# Patient Record
Sex: Female | Born: 1960 | Race: Black or African American | Hispanic: No | Smoking: Current every day smoker
Health system: Southern US, Community
[De-identification: ages and names within clinical notes are randomized; demographics above are authoritative.]

## PROBLEM LIST (undated history)

## (undated) DIAGNOSIS — G8929 Other chronic pain: Secondary | ICD-10-CM

## (undated) DIAGNOSIS — D4959 Neoplasm of unspecified behavior of other genitourinary organ: Secondary | ICD-10-CM

## (undated) DIAGNOSIS — M542 Cervicalgia: Secondary | ICD-10-CM

## (undated) DIAGNOSIS — K219 Gastro-esophageal reflux disease without esophagitis: Secondary | ICD-10-CM

## (undated) DIAGNOSIS — M5412 Radiculopathy, cervical region: Secondary | ICD-10-CM

## (undated) DIAGNOSIS — R519 Headache, unspecified: Secondary | ICD-10-CM

## (undated) DIAGNOSIS — J45909 Unspecified asthma, uncomplicated: Secondary | ICD-10-CM

## (undated) DIAGNOSIS — C50919 Malignant neoplasm of unspecified site of unspecified female breast: Secondary | ICD-10-CM

## (undated) DIAGNOSIS — R51 Headache: Secondary | ICD-10-CM

## (undated) HISTORY — PX: ABDOMINAL HYSTERECTOMY: SHX81

## (undated) HISTORY — PX: LEFT OOPHORECTOMY: SHX1961

---

## 2001-08-15 ENCOUNTER — Encounter: Payer: Self-pay | Admitting: Emergency Medicine

## 2001-08-15 ENCOUNTER — Emergency Department (HOSPITAL_COMMUNITY): Admission: EM | Admit: 2001-08-15 | Discharge: 2001-08-15 | Payer: Self-pay | Admitting: Emergency Medicine

## 2002-02-20 ENCOUNTER — Emergency Department (HOSPITAL_COMMUNITY): Admission: EM | Admit: 2002-02-20 | Discharge: 2002-02-20 | Payer: Self-pay | Admitting: Emergency Medicine

## 2002-02-20 ENCOUNTER — Encounter: Payer: Self-pay | Admitting: Emergency Medicine

## 2003-04-14 ENCOUNTER — Emergency Department (HOSPITAL_COMMUNITY): Admission: EM | Admit: 2003-04-14 | Discharge: 2003-04-14 | Payer: Self-pay | Admitting: Emergency Medicine

## 2004-05-11 ENCOUNTER — Emergency Department (HOSPITAL_COMMUNITY): Admission: EM | Admit: 2004-05-11 | Discharge: 2004-05-11 | Payer: Self-pay | Admitting: Emergency Medicine

## 2004-06-07 ENCOUNTER — Emergency Department (HOSPITAL_COMMUNITY): Admission: EM | Admit: 2004-06-07 | Discharge: 2004-06-08 | Payer: Self-pay | Admitting: Emergency Medicine

## 2004-06-22 ENCOUNTER — Emergency Department (HOSPITAL_COMMUNITY): Admission: EM | Admit: 2004-06-22 | Discharge: 2004-06-22 | Payer: Self-pay | Admitting: Emergency Medicine

## 2006-10-22 ENCOUNTER — Emergency Department (HOSPITAL_COMMUNITY): Admission: EM | Admit: 2006-10-22 | Discharge: 2006-10-22 | Payer: Self-pay | Admitting: Emergency Medicine

## 2008-03-21 ENCOUNTER — Emergency Department (HOSPITAL_COMMUNITY): Admission: EM | Admit: 2008-03-21 | Discharge: 2008-03-21 | Payer: Self-pay | Admitting: Emergency Medicine

## 2009-05-07 ENCOUNTER — Emergency Department (HOSPITAL_COMMUNITY): Admission: EM | Admit: 2009-05-07 | Discharge: 2009-05-07 | Payer: Self-pay | Admitting: Emergency Medicine

## 2009-08-08 ENCOUNTER — Emergency Department (HOSPITAL_COMMUNITY): Admission: EM | Admit: 2009-08-08 | Discharge: 2009-08-08 | Payer: Self-pay | Admitting: Emergency Medicine

## 2009-08-10 ENCOUNTER — Observation Stay (HOSPITAL_COMMUNITY): Admission: EM | Admit: 2009-08-10 | Discharge: 2009-08-10 | Payer: Self-pay | Admitting: Emergency Medicine

## 2009-09-20 ENCOUNTER — Emergency Department (HOSPITAL_COMMUNITY): Admission: EM | Admit: 2009-09-20 | Discharge: 2009-09-20 | Payer: Self-pay | Admitting: Emergency Medicine

## 2009-10-05 ENCOUNTER — Emergency Department (HOSPITAL_COMMUNITY): Admission: EM | Admit: 2009-10-05 | Discharge: 2009-10-05 | Payer: Self-pay | Admitting: Emergency Medicine

## 2010-07-01 LAB — URINALYSIS, ROUTINE W REFLEX MICROSCOPIC
Bilirubin Urine: NEGATIVE
Glucose, UA: NEGATIVE mg/dL
Hgb urine dipstick: NEGATIVE
Nitrite: NEGATIVE
Nitrite: NEGATIVE
Specific Gravity, Urine: 1.025 (ref 1.005–1.030)
Urobilinogen, UA: 0.2 mg/dL (ref 0.0–1.0)
Urobilinogen, UA: 1 mg/dL (ref 0.0–1.0)
pH: 6 (ref 5.0–8.0)

## 2010-07-01 LAB — DIFFERENTIAL
Basophils Relative: 0 % (ref 0–1)
Eosinophils Relative: 0 % (ref 0–5)
Lymphs Abs: 4.1 10*3/uL — ABNORMAL HIGH (ref 0.7–4.0)
Monocytes Absolute: 1.1 10*3/uL — ABNORMAL HIGH (ref 0.1–1.0)
Monocytes Relative: 15 % — ABNORMAL HIGH (ref 3–12)
Neutro Abs: 2.1 10*3/uL (ref 1.7–7.7)
Neutrophils Relative %: 29 % — ABNORMAL LOW (ref 43–77)

## 2010-07-01 LAB — PROTIME-INR
INR: 1.05 (ref 0.00–1.49)
Prothrombin Time: 13.6 seconds (ref 11.6–15.2)

## 2010-07-01 LAB — CK TOTAL AND CKMB (NOT AT ARMC)
CK, MB: 0.7 ng/mL (ref 0.3–4.0)
Relative Index: INVALID (ref 0.0–2.5)

## 2010-07-01 LAB — COMPREHENSIVE METABOLIC PANEL
AST: 30 U/L (ref 0–37)
GFR calc non Af Amer: >60 mL/min (ref >60)
Glucose, Bld: 93 mg/dL (ref 70–99)
Potassium: 3.8 mEq/L (ref 3.5–5.1)
Sodium: 137 mEq/L (ref 135–145)
Total Bilirubin: 0.6 mg/dL (ref 0.3–1.2)

## 2010-07-01 LAB — URINE CULTURE
Colony Count: NO GROWTH
Culture: NO GROWTH

## 2010-07-01 LAB — CBC
HCT: 36.8 % (ref 36.0–46.0)
MCHC: 35.2 g/dL (ref 30.0–36.0)
RDW: 14.6 % (ref 11.5–15.5)
WBC: 7.3 10*3/uL (ref 4.0–10.5)

## 2010-07-01 LAB — ROCKY MTN SPOTTED FVR AB, IGG-BLOOD: RMSF IgG: 0.33 IV

## 2010-07-02 LAB — URINALYSIS, ROUTINE W REFLEX MICROSCOPIC
Bilirubin Urine: NEGATIVE
Ketones, ur: NEGATIVE mg/dL
Urobilinogen, UA: 0.2 mg/dL (ref 0.0–1.0)
pH: 6.5 (ref 5.0–8.0)

## 2010-07-02 LAB — DIFFERENTIAL
Basophils Absolute: 0 10*3/uL (ref 0.0–0.1)
Basophils Relative: 1 % (ref 0–1)
Eosinophils Relative: 1 % (ref 0–5)

## 2010-07-02 LAB — COMPREHENSIVE METABOLIC PANEL
AST: 19 U/L (ref 0–37)
Alkaline Phosphatase: 69 U/L (ref 39–117)
BUN: 7 mg/dL (ref 6–23)
CO2: 26 mEq/L (ref 19–32)
Creatinine, Ser: 0.72 mg/dL (ref 0.4–1.2)
GFR calc Af Amer: >60 mL/min (ref >60)
Glucose, Bld: 95 mg/dL (ref 70–99)
Sodium: 135 mEq/L (ref 135–145)
Total Protein: 7.9 g/dL (ref 6.0–8.3)

## 2010-07-02 LAB — CBC
Hemoglobin: 14.1 g/dL (ref 12.0–15.0)
RBC: 4.57 MIL/uL (ref 3.87–5.11)
RDW: 14 % (ref 11.5–15.5)
WBC: 5.2 10*3/uL (ref 4.0–10.5)

## 2010-07-02 LAB — URINE CULTURE: Culture: NO GROWTH

## 2010-07-02 LAB — LIPASE, BLOOD: Lipase: 21 U/L (ref 11–59)

## 2010-07-03 LAB — CBC
MCHC: 35 g/dL (ref 30.0–36.0)
MCV: 92 fL (ref 78.0–100.0)
Platelets: 178 10*3/uL (ref 150–400)
RDW: 14.4 % (ref 11.5–15.5)

## 2010-07-03 LAB — DIFFERENTIAL
Basophils Absolute: 0.2 10*3/uL — ABNORMAL HIGH (ref 0.0–0.1)
Basophils Relative: 2 % — ABNORMAL HIGH (ref 0–1)
Eosinophils Absolute: 0.2 10*3/uL (ref 0.0–0.7)
Lymphocytes Relative: 34 % (ref 12–46)

## 2010-07-03 LAB — URINALYSIS, ROUTINE W REFLEX MICROSCOPIC
Bilirubin Urine: NEGATIVE
Bilirubin Urine: NEGATIVE
Glucose, UA: NEGATIVE mg/dL
Glucose, UA: NEGATIVE mg/dL
Hgb urine dipstick: NEGATIVE
Hgb urine dipstick: NEGATIVE
Ketones, ur: NEGATIVE mg/dL
Nitrite: NEGATIVE
Specific Gravity, Urine: >1.03 — ABNORMAL HIGH (ref 1.005–1.030)
Specific Gravity, Urine: 1.006 (ref 1.005–1.030)
Urobilinogen, UA: 0.2 mg/dL (ref 0.0–1.0)
pH: 6 (ref 5.0–8.0)
pH: 6.5 (ref 5.0–8.0)

## 2010-07-03 LAB — BASIC METABOLIC PANEL
BUN: 11 mg/dL (ref 6–23)
Calcium: 9.1 mg/dL (ref 8.4–10.5)
Chloride: 106 mEq/L (ref 96–112)
GFR calc Af Amer: >60 mL/min (ref >60)
GFR calc non Af Amer: >60 mL/min (ref >60)
Glucose, Bld: 97 mg/dL (ref 70–99)
Sodium: 136 mEq/L (ref 135–145)

## 2011-01-02 ENCOUNTER — Emergency Department (HOSPITAL_COMMUNITY): Payer: Self-pay

## 2011-01-02 ENCOUNTER — Emergency Department (HOSPITAL_COMMUNITY)
Admission: EM | Admit: 2011-01-02 | Discharge: 2011-01-02 | Disposition: A | Discharge status: Home / Self Care | Payer: Self-pay | Attending: Emergency Medicine | Admitting: Emergency Medicine

## 2011-01-02 DIAGNOSIS — M436 Torticollis: Secondary | ICD-10-CM | POA: Insufficient documentation

## 2011-01-02 DIAGNOSIS — F17200 Nicotine dependence, unspecified, uncomplicated: Secondary | ICD-10-CM | POA: Insufficient documentation

## 2011-01-02 MED ORDER — IBUPROFEN 800 MG PO TABS
800.0000 mg | ORAL_TABLET | Freq: Once | ORAL | Status: AC
  Administered 2011-01-02: 800 mg via ORAL
  Filled 2011-01-02: qty 1

## 2011-01-02 MED ORDER — CYCLOBENZAPRINE HCL 10 MG PO TABS: 10.0000 mg | ORAL_TABLET | Freq: Three times a day (TID) | ORAL | Status: AC | PRN

## 2011-01-02 MED ORDER — IBUPROFEN 800 MG PO TABS: 800.0000 mg | ORAL_TABLET | Freq: Three times a day (TID) | ORAL | Status: AC | PRN

## 2011-01-02 MED ORDER — CYCLOBENZAPRINE HCL 10 MG PO TABS
10.0000 mg | ORAL_TABLET | Freq: Once | ORAL | Status: AC
  Administered 2011-01-02: 10 mg via ORAL
  Filled 2011-01-02: qty 1

## 2011-01-02 NOTE — ED Notes (Signed)
Pt a/ox4. Resp even and unlabored. NAD at this time. D/C instructions reviewed with pt. Pt verbalized understanding. Pt ambulated to POV with steady gate. 

## 2011-01-02 NOTE — ED Notes (Signed)
Pt reports woke up Monday with pain in r arm  From shoulder down to hand.  Denies injury.  Says at times her fingers are numb.  Denies any injury.  Says thought maybe slept on her arm wrong.  Pt says pain has progressively gotten worse.  Denies any chest pain or jaw pain.

## 2011-01-02 NOTE — ED Provider Notes (Signed)
Medical screening examination/treatment/procedure(s) were performed by non-physician practitioner and as supervising physician I was immediately available for consultation/collaboration.  Geoffery Lyons, MD 01/02/11 [phone]

## 2011-01-02 NOTE — ED Provider Notes (Signed)
History     CSN: [id number] Arrival date & time: 01/02/2011  3:36 PM   Chief Complaint  Patient presents with  . Arm Pain     (Include location/radiation/quality/duration/timing/severity/associated sxs/prior treatment) Patient is a 50 y.o. female presenting with arm pain. The history is provided by the patient.  Arm Pain This is a new problem. Episode onset: 2 days ago. The problem occurs constantly. The problem has been gradually worsening. Associated symptoms include neck pain and numbness. Pertinent negatives include no abdominal pain, arthralgias, chest pain, chills, congestion, coughing, fatigue, fever, headaches, joint swelling, nausea, rash, sore throat or weakness.     History reviewed. No pertinent past medical history.   Past Surgical History  Procedure Date  . Abdominal hysterectomy     No family history on file.  History  Substance Use Topics  . Smoking status: Current Everyday Smoker  . Smokeless tobacco: Not on file  . Alcohol Use: No    OB History    Grav Para Term Preterm Abortions TAB SAB Ect Mult Living                  Review of Systems  Constitutional: Negative for fever, chills and fatigue.  HENT: Positive for neck pain and neck stiffness. Negative for congestion and sore throat.   Eyes: Negative.   Respiratory: Negative for cough, chest tightness and shortness of breath.   Cardiovascular: Negative for chest pain.  Gastrointestinal: Negative for nausea and abdominal pain.  Genitourinary: Negative.   Musculoskeletal: Negative for back pain, joint swelling and arthralgias.  Skin: Negative.  Negative for rash and wound.  Neurological: Positive for numbness. Negative for dizziness, weakness, light-headedness and headaches.  Hematological: Negative.   Psychiatric/Behavioral: Negative.     Allergies  Penicillins  Home Medications   Current Outpatient Rx  Name Route Sig Dispense Refill  . ACETAMINOPHEN 500 MG PO TABS Oral Take 1,000 mg by  mouth as needed. For pain     . ALBUTEROL SULFATE HFA 108 (90 BASE) MCG/ACT IN AERS Inhalation Inhale 2 puffs into the lungs every 6 (six) hours as needed. For shortness of breath     . CYCLOBENZAPRINE HCL 10 MG PO TABS Oral Take 1 tablet (10 mg total) by mouth 3 (three) times daily as needed for muscle spasms. 15 tablet 0  . IBUPROFEN 800 MG PO TABS Oral Take 1 tablet (800 mg total) by mouth every 8 (eight) hours as needed for pain. 15 tablet 0    Physical Exam    BP 124/89  Pulse 86  Temp(Src) 98.8 F (37.1 C) (Oral)  Resp 20  Ht 5\' 2"  (1.575 m)  Wt 147 lb (66.679 kg)  BMI 26.89 kg/m2  SpO2 99%  Physical Exam  Constitutional: She is oriented to person, place, and time. She appears well-developed and well-nourished.  HENT:  Head: Normocephalic.  Eyes: Conjunctivae are normal.  Neck: Normal range of motion. Neck supple.  Cardiovascular: Regular rhythm and intact distal pulses.        Pedal pulses normal.  Pulmonary/Chest: Effort normal. She has no wheezes.  Abdominal: Soft. Bowel sounds are normal. She exhibits no distension and no mass.  Musculoskeletal: She exhibits tenderness. She exhibits no edema.       Decreased ROM of neck with right head rotation.TTP right lateral neck musculature.  No midline ttp.  Neurological: She is alert and oriented to person, place, and time. She has normal strength. She displays no atrophy and no tremor. No cranial nerve  deficit or sensory deficit. Gait normal.  Reflex Scores:      Bicep reflexes are 2+ on the right side and 2+ on the left side.      No strength deficit noted.  Equal grip strength.  Decreased sensation to fine touch right dorsal hand.  Normal sensation in fingers and finger tips and forearm.  Skin: Skin is warm and dry. No rash noted.  Psychiatric: She has a normal mood and affect.    ED Course  Procedures   Dg Cervical Spine Complete  01/02/2011  *RADIOLOGY REPORT*  Clinical Data: Pain in right arm from shoulder by hand, no  known injury  CERVICAL SPINE - COMPLETE 4+ VIEW  Comparison: None  Findings: Vertebral body and disc space heights maintained. Prevertebral soft tissues upper normal thickness at C5. No fracture, subluxation or bone destruction. Mildly prominent adenoids. Bony foramina patent. C1 C2-11 normal.  IMPRESSION: No acute cervical spine abnormalities identified. If patient has persistent radicular symptoms, consider follow-up MRI imaging of the cervical spine.  Original Report Authenticated By: Lollie Marrow, M.D.     1. Torticollis, unspecified      MDM Torticollis.       Candis Musa, PA 01/02/11 1753

## 2011-01-02 NOTE — ED Notes (Signed)
Pt says pain in arm is much worse with movement.

## 2011-01-17 LAB — URINALYSIS, ROUTINE W REFLEX MICROSCOPIC
Nitrite: NEGATIVE
Protein, ur: NEGATIVE mg/dL
Specific Gravity, Urine: 1.015 (ref 1.005–1.030)
Urobilinogen, UA: 0.2 mg/dL (ref 0.0–1.0)

## 2011-01-17 LAB — DIFFERENTIAL
Basophils Relative: 0 % (ref 0–1)
Eosinophils Absolute: 0.2 10*3/uL (ref 0.0–0.7)
Lymphs Abs: 2.3 10*3/uL (ref 0.7–4.0)
Neutro Abs: 3.5 10*3/uL (ref 1.7–7.7)
Neutrophils Relative %: 55 % (ref 43–77)

## 2011-01-17 LAB — BASIC METABOLIC PANEL
BUN: 6 mg/dL (ref 6–23)
Calcium: 9 mg/dL (ref 8.4–10.5)
Chloride: 106 mEq/L (ref 96–112)
Creatinine, Ser: 0.68 mg/dL (ref 0.4–1.2)
GFR calc Af Amer: >60 mL/min (ref >60)

## 2011-01-17 LAB — CBC
MCHC: 34.1 g/dL (ref 30.0–36.0)
MCV: 92.4 fL (ref 78.0–100.0)
Platelets: 178 10*3/uL (ref 150–400)
WBC: 6.3 10*3/uL (ref 4.0–10.5)

## 2011-01-17 LAB — STREP A DNA PROBE: Group A Strep Probe: NEGATIVE

## 2011-01-17 LAB — GLUCOSE, CAPILLARY: Glucose-Capillary: 83 mg/dL (ref 70–99)

## 2012-08-10 ENCOUNTER — Encounter (HOSPITAL_COMMUNITY): Payer: Self-pay | Admitting: Emergency Medicine

## 2012-08-10 ENCOUNTER — Emergency Department (HOSPITAL_COMMUNITY): Payer: Self-pay

## 2012-08-10 ENCOUNTER — Emergency Department (HOSPITAL_COMMUNITY)
Admission: EM | Admit: 2012-08-10 | Discharge: 2012-08-11 | Disposition: A | Discharge status: Home / Self Care | Payer: Self-pay | Attending: Emergency Medicine | Admitting: Emergency Medicine

## 2012-08-10 DIAGNOSIS — R5381 Other malaise: Secondary | ICD-10-CM | POA: Insufficient documentation

## 2012-08-10 DIAGNOSIS — A5901 Trichomonal vulvovaginitis: Secondary | ICD-10-CM | POA: Insufficient documentation

## 2012-08-10 DIAGNOSIS — M65341 Trigger finger, right ring finger: Secondary | ICD-10-CM

## 2012-08-10 DIAGNOSIS — J3489 Other specified disorders of nose and nasal sinuses: Secondary | ICD-10-CM | POA: Insufficient documentation

## 2012-08-10 DIAGNOSIS — Z90711 Acquired absence of uterus with remaining cervical stump: Secondary | ICD-10-CM | POA: Insufficient documentation

## 2012-08-10 DIAGNOSIS — J309 Allergic rhinitis, unspecified: Secondary | ICD-10-CM | POA: Insufficient documentation

## 2012-08-10 DIAGNOSIS — F17200 Nicotine dependence, unspecified, uncomplicated: Secondary | ICD-10-CM | POA: Insufficient documentation

## 2012-08-10 DIAGNOSIS — M6530 Trigger finger, unspecified finger: Secondary | ICD-10-CM | POA: Insufficient documentation

## 2012-08-10 DIAGNOSIS — R109 Unspecified abdominal pain: Secondary | ICD-10-CM | POA: Insufficient documentation

## 2012-08-10 DIAGNOSIS — Z79899 Other long term (current) drug therapy: Secondary | ICD-10-CM | POA: Insufficient documentation

## 2012-08-10 DIAGNOSIS — R209 Unspecified disturbances of skin sensation: Secondary | ICD-10-CM | POA: Insufficient documentation

## 2012-08-10 DIAGNOSIS — Z880 Allergy status to penicillin: Secondary | ICD-10-CM | POA: Insufficient documentation

## 2012-08-10 LAB — URINALYSIS, ROUTINE W REFLEX MICROSCOPIC
Leukocytes, UA: NEGATIVE
Nitrite: NEGATIVE
Protein, ur: NEGATIVE mg/dL
Urobilinogen, UA: 0.2 mg/dL (ref 0.0–1.0)

## 2012-08-10 MED ORDER — IBUPROFEN 800 MG PO TABS
800.0000 mg | ORAL_TABLET | Freq: Once | ORAL | Status: AC
  Administered 2012-08-10: 800 mg via ORAL
  Filled 2012-08-10: qty 1

## 2012-08-10 NOTE — ED Notes (Signed)
Patient c/o left flank pain x 2-3 weeks.  Patient denies N/V or any urinary symptoms.

## 2012-08-10 NOTE — ED Provider Notes (Addendum)
History    This chart was scribed for Jones Skene, MD by Gerlean Ren, ED Scribe. This patient was seen in room [facility] and the patient's care was started at 11:31 PM    CSN: [id number]  Arrival date & time 08/10/12  2319   First MD Initiated Contact with Patient 08/10/12 2329      Chief Complaint  Patient presents with  . Flank Pain     The history is provided by the patient. No language interpreter was used.  Alexis Lucas is a 52 y.o. female who presents to the Emergency Department complaining of dull, throbbing pain over the left flank, LLQ, and left lower back area with no areas having greater or lesser pain waxing-and-waning over the past 2-3 weeks.  Pain is moderate. Pt denies dysuria, frequency, nausea, emesis, diarrhea, vaginal discharge, chest pains, rash, myalgias, arthralgias, sore throat, changes in vision, double vision, blurred vision.  Pt reports some regular seasonal allergy congestion and watery eyes that has resolved on its own.   Pt also reports a yeast infection several weeks ago and some unusual odor from her vagina that she thinks may be an STD.   Pt also reports chronic tingling in right hand with associated weakness, pain, and trigger finger in right 4th finger. Pt has had partial hysterectomy and left oophorectomy. Pt reports extensive family h/o DM.   History reviewed. No pertinent past medical history.  Past Surgical History  Procedure Laterality Date  . Abdominal hysterectomy      No family history on file.  History  Substance Use Topics  . Smoking status: Current Every Day Smoker  . Smokeless tobacco: Not on file  . Alcohol Use: No    No OB history provided.   Review of Systems  Constitutional: Negative for fever.  HENT: Negative for congestion, sore throat and sinus pressure.   Eyes: Negative for visual disturbance.  Respiratory: Negative for cough.   Cardiovascular: Negative for chest pain.  Gastrointestinal: Positive for abdominal  pain. Negative for nausea, vomiting and diarrhea.  Genitourinary: Positive for flank pain. Negative for dysuria, frequency and vaginal discharge.  Musculoskeletal: Positive for back pain. Negative for myalgias and arthralgias.  Skin: Negative for rash.  All other systems reviewed and are negative.    Allergies  Penicillins  Home Medications   Current Outpatient Rx  Name  Route  Sig  Dispense  Refill  . acetaminophen (TYLENOL) 500 MG tablet   Oral   Take 1,000 mg by mouth as needed. For pain          . albuterol (PROVENTIL HFA;VENTOLIN HFA) 108 (90 BASE) MCG/ACT inhaler   Inhalation   Inhale 2 puffs into the lungs every 6 (six) hours as needed. For shortness of breath            There were no vitals taken for this visit.  Physical Exam  Nursing notes reviewed.  Electronic medical record reviewed. VITAL SIGNS:   Filed Vitals:   08/10/12 2330  BP: 163/62  Pulse: 90  Temp: 97.8 F (36.6 C)  TempSrc: Oral  Resp: 18  Height: 5\' 2"  (1.575 m)  Weight: 157 lb (71.215 kg)  SpO2: 100%   CONSTITUTIONAL: Awake, oriented, appears non-toxic HENT: Atraumatic, normocephalic, oral mucosa pink and moist, airway patent. Nares patent without drainage. External ears normal. EYES: Conjunctiva clear, EOMI, PERRLA NECK: Trachea midline, non-tender, supple CARDIOVASCULAR: Normal heart rate, Normal rhythm, No murmurs, rubs, gallops PULMONARY/CHEST: Clear to auscultation, no rhonchi, wheezes, or rales.  Symmetrical breath sounds. Non-tender. ABDOMINAL: Non-distended, soft, non-tender - no rebound or guarding.  BS normal. No CVA tenderness PELVIC EXAM: normal external genitalia, vulva, vaginal cuff intact, status post total hysterectomy. NEUROLOGIC: Non-focal, moving all four extremities, no gross sensory or motor deficits. EXTREMITIES: No clubbing, cyanosis, or edema. Right ring finger, trigger finger - palpable knot in the flexor tendon around the A1 pulley. Positive Tinel's, positive  Phalen's. Strength is 5 out of 5 grip, wrist extension, flexion, finger abduction and adduction SKIN: Warm, Dry, No erythema, No rash  ED Course  Procedures (including critical care time) DIAGNOSTIC STUDIES: No O2 taken.    COORDINATION OF CARE: 11:45 PM- Patient informed of clinical course including pelvic exam.  She understands medical decision-making process and agrees with plan.  Labs Reviewed  URINALYSIS, ROUTINE W REFLEX MICROSCOPIC - Abnormal; Notable for the following:    Ketones, ur TRACE (*)    All other components within normal limits  BASIC METABOLIC PANEL - Abnormal; Notable for the following:    Glucose, Bld 112 (*)    All other components within normal limits  WET PREP, GENITAL  GC/CHLAMYDIA PROBE AMP   Dg Abd 2 Views  08/11/2012  *RADIOLOGY REPORT*  Clinical Data: Left flank pain for 3 weeks.  ABDOMEN - 2 VIEW  Comparison: CT abdomen and pelvis 08/08/2009  Findings: Normal bowel gas pattern with scattered gas and stool in the colon.  No small or large bowel distension.  No free intra- abdominal air.  No abnormal air fluid levels.  Multiple calcifications in the pelvis consistent with phleboliths.  No radiopaque stones are demonstrated.  Vascular calcifications. Visualized bones appear intact.  IMPRESSION: Nonobstructive bowel gas pattern.   Original Report Authenticated By: Burman Nieves, M.D.      1. Flank pain   2. Trigger ring finger, right       MDM  Alexis Lucas is a 52 y.o. female presenting with left flank pain. Patient's presentation is not concerning at this time for perforation, kidney stone, diverticulitis. She likely has a musculoskeletal pain along the left flank. We'll screen with urine, x-ray, history of DM in family, we'll also screen for kidney function and glucose levels with BMP.  Patient is concerned about STI because of her husband's separation from her, denies any vaginal discharge complaints, would like to be tested for STI.  Pelvic exam  is unremarkable, sent for GC chlamydia -doubt infection without cervix. No trichomonas seen in urine  Patient does have injured her finger on the right hand as well as carpal tunnel syndrome with no weakness yet, she is having night time pain symptoms, at this point will refer to Dr. Janee Morn who is on-call for hand this evening. Patient may benefit from discussion of carpal tunnel release and trigger finger release. Patient put in a right-sided Velcro splint - told to use this as much is possible but not to use it at work if it is causing a dangerous situation where she works on an [employer] stage [employer].  Patient's wet prep comes back positive for trichomonas, will treat with metronidazole. Discussed not drinking alcohol with metronidazole. Again I do not think it is prudent to treat the patient with other antibiotics for [facility] committee as the patient has no cervix,  Patient to follow up in 2-3 days with her primary care physician for her left flank pain  I personally performed the services described in this documentation, which was scribed in my presence. The recorded information has been reviewed and  is accurate. Jones Skene, M.D.     Jones Skene, MD 08/11/12 0104  Jones Skene, MD 08/11/12 9147

## 2012-08-11 LAB — BASIC METABOLIC PANEL
CO2: 24 mEq/L (ref 19–32)
Chloride: 102 mEq/L (ref 96–112)
GFR calc non Af Amer: >90 mL/min (ref >90)
Glucose, Bld: 112 mg/dL — ABNORMAL HIGH (ref 70–99)
Potassium: 3.5 mEq/L (ref 3.5–5.1)
Sodium: 136 mEq/L (ref 135–145)

## 2012-08-11 LAB — WET PREP, GENITAL

## 2012-08-11 MED ORDER — METRONIDAZOLE 500 MG PO TABS: 500.0000 mg | ORAL_TABLET | Freq: Two times a day (BID) | ORAL | Status: DC

## 2012-08-11 MED ORDER — IBUPROFEN 600 MG PO TABS: 600.0000 mg | ORAL_TABLET | Freq: Four times a day (QID) | ORAL | Status: DC | PRN

## 2012-11-01 ENCOUNTER — Encounter (HOSPITAL_COMMUNITY): Payer: Self-pay | Admitting: Emergency Medicine

## 2012-11-01 ENCOUNTER — Emergency Department (HOSPITAL_COMMUNITY)
Admission: EM | Admit: 2012-11-01 | Discharge: 2012-11-01 | Disposition: A | Discharge status: Home / Self Care | Payer: Self-pay | Attending: Emergency Medicine | Admitting: Emergency Medicine

## 2012-11-01 DIAGNOSIS — R3915 Urgency of urination: Secondary | ICD-10-CM | POA: Insufficient documentation

## 2012-11-01 DIAGNOSIS — F17200 Nicotine dependence, unspecified, uncomplicated: Secondary | ICD-10-CM | POA: Insufficient documentation

## 2012-11-01 DIAGNOSIS — Z90710 Acquired absence of both cervix and uterus: Secondary | ICD-10-CM | POA: Insufficient documentation

## 2012-11-01 DIAGNOSIS — M549 Dorsalgia, unspecified: Secondary | ICD-10-CM | POA: Insufficient documentation

## 2012-11-01 DIAGNOSIS — Z79899 Other long term (current) drug therapy: Secondary | ICD-10-CM | POA: Insufficient documentation

## 2012-11-01 DIAGNOSIS — Z880 Allergy status to penicillin: Secondary | ICD-10-CM | POA: Insufficient documentation

## 2012-11-01 DIAGNOSIS — R350 Frequency of micturition: Secondary | ICD-10-CM | POA: Insufficient documentation

## 2012-11-01 DIAGNOSIS — N390 Urinary tract infection, site not specified: Secondary | ICD-10-CM

## 2012-11-01 DIAGNOSIS — R630 Anorexia: Secondary | ICD-10-CM | POA: Insufficient documentation

## 2012-11-01 DIAGNOSIS — R319 Hematuria, unspecified: Secondary | ICD-10-CM | POA: Insufficient documentation

## 2012-11-01 DIAGNOSIS — R109 Unspecified abdominal pain: Secondary | ICD-10-CM | POA: Insufficient documentation

## 2012-11-01 LAB — URINALYSIS, ROUTINE W REFLEX MICROSCOPIC
Specific Gravity, Urine: 1.01 (ref 1.005–1.030)
Urobilinogen, UA: >8 mg/dL — ABNORMAL HIGH (ref 0.0–1.0)
pH: 5 (ref 5.0–8.0)

## 2012-11-01 LAB — URINE MICROSCOPIC-ADD ON

## 2012-11-01 MED ORDER — PHENAZOPYRIDINE HCL 200 MG PO TABS: 200.0000 mg | ORAL_TABLET | Freq: Three times a day (TID) | ORAL | Status: DC

## 2012-11-01 MED ORDER — SULFAMETHOXAZOLE-TRIMETHOPRIM 800-160 MG PO TABS: 1.0000 | ORAL_TABLET | Freq: Two times a day (BID) | ORAL | Status: DC

## 2012-11-01 NOTE — ED Notes (Signed)
Pt c/o painful urination and blood in urine since this am. Pain to suprapubic area. nad mm wet.

## 2012-11-01 NOTE — ED Provider Notes (Signed)
History    CSN: [id number] Arrival date & time 11/01/12  1547  First MD Initiated Contact with Patient 11/01/12 1603     Chief Complaint  Patient presents with  . Dysuria  . Hematuria   (Consider location/radiation/quality/duration/timing/severity/associated sxs/prior Treatment) Patient is a 52 y.o. female presenting with dysuria. The history is provided by the patient.  Dysuria Pain quality:  Burning Pain severity:  Moderate Onset quality:  Gradual Timing:  Intermittent Progression:  Worsening Chronicity:  New Recent urinary tract infections: no   Relieved by:  Nothing Worsened by:  Nothing tried Ineffective treatments:  Phenazopyridine Urinary symptoms: discolored urine   Associated symptoms: abdominal pain   Associated symptoms: no fever, no nausea, no vaginal discharge and no vomiting   Risk factors: hx of pyelonephritis (years ago) and sexually active (last intercourse yesterday)   Risk factors: no hx of urolithiasis, no kidney transplant, not pregnant, no renal disease, no sexually transmitted infections and no urinary catheter   Alexis Lucas is a 52 y.o. female who presents to the ED with UTI symptoms that started this morning. She took OTC pyridium without relief. She is a G5 P2 (SAB x3). Last pap smear one year ago and was normal, current sex partner x 32 years. Hysterectomy for birth control. No history of STI's.   History reviewed. No pertinent past medical history. Past Surgical History  Procedure Laterality Date  . Abdominal hysterectomy     History reviewed. No pertinent family history. History  Substance Use Topics  . Smoking status: Current Every Day Smoker  . Smokeless tobacco: Not on file  . Alcohol Use: No   OB History   Grav Para Term Preterm Abortions TAB SAB Ect Mult Living                 Review of Systems  Constitutional: Positive for appetite change. Negative for fever and chills.  HENT: Negative for ear pain, sore throat and neck pain.    Gastrointestinal: Positive for abdominal pain. Negative for nausea, vomiting and diarrhea.  Genitourinary: Positive for dysuria, urgency and frequency. Negative for vaginal bleeding, vaginal discharge, vaginal pain and dyspareunia.  Musculoskeletal: Positive for back pain.  Skin: Negative for rash.  Allergic/Immunologic: Negative for food allergies.  Neurological: Negative for light-headedness and headaches.  Psychiatric/Behavioral: The patient is not nervous/anxious.     Allergies  Penicillins  Home Medications   Current Outpatient Rx  Name  Route  Sig  Dispense  Refill  . acetaminophen (TYLENOL) 500 MG tablet   Oral   Take 1,000 mg by mouth as needed. For pain          . albuterol (PROVENTIL HFA;VENTOLIN HFA) 108 (90 BASE) MCG/ACT inhaler   Inhalation   Inhale 2 puffs into the lungs every 6 (six) hours as needed. For shortness of breath          . ibuprofen (ADVIL,MOTRIN) 600 MG tablet   Oral   Take 1 tablet (600 mg total) by mouth every 6 (six) hours as needed for pain.   30 tablet   0   . metroNIDAZOLE (FLAGYL) 500 MG tablet   Oral   Take 1 tablet (500 mg total) by mouth 2 (two) times daily.   14 tablet   0    BP 173/90  Pulse 72  Temp(Src) 98.4 F (36.9 C) (Oral)  Resp 16  SpO2 100% Physical Exam  Nursing note and vitals reviewed. Constitutional: She is oriented to person, place, and time. She  appears well-developed and well-nourished. No distress.  HENT:  Head: Normocephalic.  Eyes: EOM are normal.  Neck: Neck supple.  Cardiovascular: Normal rate.   Pulmonary/Chest: Effort normal.  Abdominal: Soft. Bowel sounds are normal. There is tenderness in the suprapubic area. There is no rigidity, no rebound, no guarding and no CVA tenderness.  Musculoskeletal: Normal range of motion.  Neurological: She is alert and oriented to person, place, and time. No cranial nerve deficit.  Skin: Skin is warm and dry.  Psychiatric: She has a normal mood and affect. Her  behavior is normal.    ED Course  Procedures  Results for orders placed during the hospital encounter of 11/01/12 (from the past 24 hour(s))  URINALYSIS, ROUTINE W REFLEX MICROSCOPIC     Status: Abnormal   Collection Time    11/01/12  4:05 PM      Result Value Range   Color, Urine ORANGE (*) YELLOW   APPearance CLOUDY (*) CLEAR   Specific Gravity, Urine 1.010  1.005 - 1.030   pH 5.0  5.0 - 8.0   Glucose, UA 250 (*) NEGATIVE mg/dL   Hgb urine dipstick LARGE (*) NEGATIVE   Bilirubin Urine SMALL (*) NEGATIVE   Ketones, ur TRACE (*) NEGATIVE mg/dL   Protein, ur 161 (*) NEGATIVE mg/dL   Urobilinogen, UA >0.9 (*) 0.0 - 1.0 mg/dL   Nitrite POSITIVE (*) NEGATIVE   Leukocytes, UA LARGE (*) NEGATIVE  URINE MICROSCOPIC-ADD ON     Status: Abnormal   Collection Time    11/01/12  4:05 PM      Result Value Range   Squamous Epithelial / LPF FEW (*) RARE   WBC, UA 11-20  <3 WBC/hpf   RBC / HPF 21-50  <3 RBC/hpf   Bacteria, UA FEW (*) RARE     MDM  52 y.o. female with UTI, will treat with oral antibiotics. No signs of pyelo at this time, no nausea or vomiting. Culture sent. Patient to follow up with PCP or return if symptoms worsen. Discussed with the patient clinical findings and plan of care and all questioned fully answered.   Medication List    TAKE these medications       phenazopyridine 200 MG tablet  Commonly known as:  PYRIDIUM  Take 1 tablet (200 mg total) by mouth 3 (three) times daily.     sulfamethoxazole-trimethoprim 800-160 MG per tablet  Commonly known as:  SEPTRA DS  Take 1 tablet by mouth 2 (two) times daily.      ASK your doctor about these medications       acetaminophen 500 MG tablet  Commonly known as:  TYLENOL  Take 1,000 mg by mouth as needed. For pain     albuterol 108 (90 BASE) MCG/ACT inhaler  Commonly known as:  PROVENTIL HFA;VENTOLIN HFA  Inhale 2 puffs into the lungs every 6 (six) hours as needed. For shortness of breath     ibuprofen 600 MG  tablet  Commonly known as:  ADVIL,MOTRIN  Take 1 tablet (600 mg total) by mouth every 6 (six) hours as needed for pain.     metroNIDAZOLE 500 MG tablet  Commonly known as:  FLAGYL  Take 1 tablet (500 mg total) by mouth 2 (two) times daily.         [place], [place] 11/01/12 [phone]

## 2012-11-01 NOTE — ED Notes (Signed)
Pt seen and evaluated by EDNP for initial assessment. 

## 2012-11-01 NOTE — ED Provider Notes (Signed)
Medical screening examination/treatment/procedure(s) were performed by non-physician practitioner and as supervising physician I was immediately available for consultation/collaboration.  Jaston Havens L Perris Tripathi, MD 11/01/12 1948 

## 2012-11-04 LAB — URINE CULTURE

## 2012-11-05 NOTE — ED Notes (Signed)
+   urine No treatment needed per Lisa Sanders 

## 2012-11-16 ENCOUNTER — Emergency Department (HOSPITAL_COMMUNITY)
Admission: EM | Admit: 2012-11-16 | Discharge: 2012-11-16 | Disposition: A | Discharge status: Home / Self Care | Payer: Self-pay | Attending: Emergency Medicine | Admitting: Emergency Medicine

## 2012-11-16 ENCOUNTER — Emergency Department (HOSPITAL_COMMUNITY): Payer: Self-pay

## 2012-11-16 ENCOUNTER — Encounter (HOSPITAL_COMMUNITY): Payer: Self-pay | Admitting: *Deleted

## 2012-11-16 DIAGNOSIS — Z79899 Other long term (current) drug therapy: Secondary | ICD-10-CM | POA: Insufficient documentation

## 2012-11-16 DIAGNOSIS — R0789 Other chest pain: Secondary | ICD-10-CM | POA: Insufficient documentation

## 2012-11-16 DIAGNOSIS — Z880 Allergy status to penicillin: Secondary | ICD-10-CM | POA: Insufficient documentation

## 2012-11-16 DIAGNOSIS — J45901 Unspecified asthma with (acute) exacerbation: Secondary | ICD-10-CM | POA: Insufficient documentation

## 2012-11-16 DIAGNOSIS — R110 Nausea: Secondary | ICD-10-CM | POA: Insufficient documentation

## 2012-11-16 DIAGNOSIS — R209 Unspecified disturbances of skin sensation: Secondary | ICD-10-CM | POA: Insufficient documentation

## 2012-11-16 DIAGNOSIS — F17200 Nicotine dependence, unspecified, uncomplicated: Secondary | ICD-10-CM | POA: Insufficient documentation

## 2012-11-16 HISTORY — DX: Unspecified asthma, uncomplicated: J45.909

## 2012-11-16 LAB — POCT I-STAT, CHEM 8
BUN: 8 mg/dL (ref 6–23)
Calcium, Ion: 1.16 mmol/L (ref 1.12–1.23)
Chloride: 109 meq/L (ref 96–112)
Creatinine, Ser: 0.7 mg/dL (ref 0.50–1.10)
Glucose, Bld: 102 mg/dL — ABNORMAL HIGH (ref 70–99)
HCT: 44 % (ref 36.0–46.0)
Hemoglobin: 15 g/dL (ref 12.0–15.0)
Potassium: 3.6 meq/L (ref 3.5–5.1)
Sodium: 142 mEq/L (ref 135–145)
TCO2: 21 mmol/L (ref 0–100)

## 2012-11-16 LAB — URINALYSIS, ROUTINE W REFLEX MICROSCOPIC
Glucose, UA: NEGATIVE mg/dL
Ketones, ur: NEGATIVE mg/dL
Leukocytes, UA: NEGATIVE
Protein, ur: NEGATIVE mg/dL
Urobilinogen, UA: 1 mg/dL (ref 0.0–1.0)

## 2012-11-16 LAB — POCT I-STAT TROPONIN I
Troponin i, poc: 0.01 ng/mL (ref 0.00–0.08)
Troponin i, poc: 0 ng/mL (ref 0.00–0.08)

## 2012-11-16 LAB — CBC
MCH: 30.3 pg (ref 26.0–34.0)
MCHC: 33.8 g/dL (ref 30.0–36.0)
RDW: 13.8 % (ref 11.5–15.5)

## 2012-11-16 MED ORDER — IPRATROPIUM BROMIDE 0.02 % IN SOLN
0.5000 mg | Freq: Once | RESPIRATORY_TRACT | Status: AC
  Administered 2012-11-16: 0.5 mg via RESPIRATORY_TRACT
  Filled 2012-11-16: qty 2.5

## 2012-11-16 MED ORDER — NITROGLYCERIN 0.4 MG SL SUBL: 0.4000 mg | SUBLINGUAL_TABLET | SUBLINGUAL | Status: DC | PRN

## 2012-11-16 MED ORDER — ALBUTEROL SULFATE (5 MG/ML) 0.5% IN NEBU
2.5000 mg | INHALATION_SOLUTION | Freq: Once | RESPIRATORY_TRACT | Status: AC
  Administered 2012-11-16: 2.5 mg via RESPIRATORY_TRACT
  Filled 2012-11-16: qty 0.5

## 2012-11-16 MED ORDER — PREDNISONE 20 MG PO TABS: ORAL_TABLET | ORAL | Status: DC

## 2012-11-16 NOTE — ED Provider Notes (Signed)
CSN: [id number]     Arrival date & time 11/16/12  0822 History     First MD Initiated Contact with Patient 11/16/12 [phone]     Chief Complaint  Patient presents with  . Chest Pain   (Consider location/radiation/quality/duration/timing/severity/associated sxs/prior Treatment) The history is provided by the patient. No language interpreter was used.  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 Patient reported that she that when she woke up this morning she did not feel like herself. Stated that when she woke up this morning at 5:00AM she was experiencing chest discomfort, localized to the right side and center of the chest described as a constant ache, pressure with pain radiating down the right arm described as a numbness and radiation towards the back. Patient reported that the pain worsens with motion, stated that the pain gets better when the patient lays down. Reported that when she was walking this morning she felt winded and out of breathe. Reported that she used her albuterol inhaler this morning, 2 puffs x 2 today. Reported mild nausea. Denied travel, fever, chills, dizziness, vomiting, diarrhea, abdominal pain, weakness, fever, chills, facial drooping, speech difficulty, blurred vision. Denied history of cardiac issues and HTN.  PCP Dr. Mirna Mires  Past Medical History  Diagnosis Date  . Asthma    Past Surgical History  Procedure Laterality Date  . Abdominal hysterectomy     No family history on file. History  Substance Use Topics  . Smoking status: Current Every Day Smoker  . Smokeless tobacco: Not on file  . Alcohol Use: No   OB History   Grav Para Term Preterm Abortions TAB SAB Ect Mult Living            Review of Systems  Constitutional: Negative for fever and chills.  HENT: Negative for sore throat, trouble swallowing, neck pain and neck stiffness.   Eyes: Negative for visual disturbance.  Respiratory: Positive for cough and shortness of breath.   Cardiovascular: Positive for chest pain.  Gastrointestinal: Positive for nausea. Negative for vomiting, abdominal pain and constipation.  Musculoskeletal: Positive for back pain.  Neurological: Positive for numbness (right arm). Negative for weakness and headaches.  All other systems reviewed and are negative.    Allergies  Penicillins  Home Medications   Current Outpatient Rx  Name  Route  Sig  Dispense  Refill  . albuterol (PROVENTIL HFA;VENTOLIN HFA) 108 (90 BASE) MCG/ACT inhaler   Inhalation   Inhale 2 puffs into the lungs every 6 (six) hours as needed. For shortness of breath          . predniSONE (DELTASONE) 20 MG tablet      Take 3 tablets PO x 3 days, take 2 tablets PO x 2 days, take 1 tablet PO x 1 day.   14 tablet   0   . sulfamethoxazole-trimethoprim (SEPTRA DS) 800-160 MG per tablet   Oral   Take 1 tablet by mouth 2 (two) times daily.   14 tablet   0    BP 160/77  Pulse 61  Temp(Src) 98.4 F (36.9 C) (Oral)  Resp 21  Ht 5\' 3"  (1.6 m)  Wt  146 lb (66.225 kg)  BMI 25.87 kg/m2  SpO2 100% Physical Exam  Nursing note and vitals reviewed. Constitutional: She is oriented to person, place, and time. She appears well-developed and well-nourished. No distress.  HENT:  Head: Normocephalic and atraumatic.  Eyes: Conjunctivae and EOM are normal. Pupils are equal, round, and reactive to light. Right eye exhibits no discharge. Left eye exhibits no discharge.  Neck: Normal range of motion. Neck supple.  Cardiovascular: Normal rate, regular rhythm and normal heart sounds.  Exam reveals no friction rub.   No murmur heard. Pulses:      Radial pulses are 2+ on the right side, and 2+ on the left side.    Pulmonary/Chest: Effort normal. No respiratory distress. She has wheezes in the right middle field, the right lower field and the left lower field. She has rhonchi in the right middle field, the right lower field and the left lower field. She has rales. She exhibits tenderness (localized to the right side of the chest and sternal region ).    Pain reproducible upon palpation to the right side of the chest  Musculoskeletal: Normal range of motion. She exhibits no tenderness.  Neurological: She is alert and oriented to person, place, and time. She exhibits normal muscle tone. Coordination normal.  Alert and oriented Cranial nerves III-XII grossly intact Strength 5+/5+ with resistance to upper and lower extremities Mild weakness noted to the right side upper and lower extremities Difficulty with differentiation to sharp and dull touch to RUE - normal to LUE  Skin: Skin is warm and dry. No rash noted. She is not diaphoretic. No erythema.  Psychiatric: She has a normal mood and affect. Thought content normal.    ED Course   Procedures (including critical care time)  10:50AM Re-assessed patient - lungs sounded much better, decreased rhonchi and wheezing. Patient denied having chest pain. Denied having difficulty breathing.   12:50PM Re-assessed patient. Denied chest pain, shortness of breath, difficulty breathing. Negative pain upon palpation to the chest wall. Lungs clear to auscultation. Negative weakness noted to the right side of the body - full ROM to RUE and RLE, strength 5+/5+, sensation intact. 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     Date: 11/16/2012  Rate: 71  Rhythm: normal sinus rhythm  QRS Axis: normal  Intervals: normal  ST/T Wave abnormalities: normal  Conduction Disutrbances:none  Narrative Interpretation:   Old EKG Reviewed: none available  Medications  nitroGLYCERIN (NITROSTAT) SL tablet 0.4 mg  (not administered)  albuterol (PROVENTIL) (5 MG/ML) 0.5% nebulizer solution 2.5 mg (2.5 mg Nebulization Given 11/16/12 1000)  ipratropium (ATROVENT) nebulizer solution 0.5 mg (0.5 mg Nebulization Given 11/16/12 0959)    Labs Reviewed  POCT I-STAT, CHEM 8 - Abnormal; Notable for the following:    Glucose, Bld 102 (*)    All other components within normal limits  CBC  URINALYSIS, ROUTINE W REFLEX MICROSCOPIC  D-DIMER, QUANTITATIVE  POCT I-STAT TROPONIN I  POCT I-STAT TROPONIN I   Dg Chest 2 View  11/16/2012   *RADIOLOGY REPORT*  Clinical Data:  Chest pain  CHEST - 2 VIEW  Comparison: March 21, 2008  Findings: There is underlying emphysematous change.  There is no edema or consolidation.  The heart size and pulmonary vascularity are normal.  No adenopathy.  No bone lesions.  IMPRESSION: Underlying emphysematous change.  No edema or consolidation.   Original Report Authenticated By: Chrissie Noa  Margarita Grizzle, M.D.   1. Asthma exacerbation, unspecified asthma severity     MDM  Patient presenting to the ED with chest pain localized to the right side of the chest and sternal region with mild radiation to the back and right arm with numbness that started this morning. BIBEMS was given ASA 325 mg and nitro x1. Patient alert and oriented. Negative respiratory distress noted. Heart normal rate and rhythm. Lungs auscultation of rhonchi basilar bilaterally, with mild wheezing noted.  UA negative findings. Chem-8 negative findings. CBC negative findings. EKG negative ischemic changes. Chest xray noted emphysema changes - negative acute cardiopulmonary findings. D-dimer negative elevation. First and second set of troponin negative elevation.  Doubt cardiac in nature. Doubt PE. Doubt stroke. Suspicion to be asthma exacerbation, possible beginnings of COPD with smoking that has been ongoing for the past 25-30 years - as per patient. Patient stable, afebrile - negative tachycardia noted, negative tachypnea, negative drop  in pulse ox. Patient tolerated nebulizers well. Discharged patient. Discharged patient with prednisone. Discussed with patient to continue to use albuterol treatment as needed for shortness of breath. Referred patient to PCP/Pulmonologist to be followed-up by the end of this week. Discussed with patient smoking cessation. Discussed with patient to continue to monitor symptoms and if symptoms are to worsen or change to report back to the ED - strict return instructions given.  Patient agreed to plan of care, understood, all questions answered.      Raymon Mutton, PA-C 11/16/12 1724

## 2012-11-16 NOTE — ED Notes (Signed)
Patient states approx 1 hour ago chest pain started in right side of chest, patient states pain increases with movement and pushing on her chest, patient states she had same type episode last night but pain subsided and went away, patient received ASA 324 mg and Nitro x 1 per EMS

## 2012-11-17 NOTE — ED Provider Notes (Signed)
Medical screening examination/treatment/procedure(s) were performed by non-physician practitioner and as supervising physician I was immediately available for consultation/collaboration.   Carmesha Morocco Y. Breon Diss, MD 11/17/12 1106 

## 2013-04-27 ENCOUNTER — Emergency Department (HOSPITAL_COMMUNITY)
Admission: EM | Admit: 2013-04-27 | Discharge: 2013-04-27 | Disposition: A | Discharge status: Home / Self Care | Payer: No Typology Code available for payment source | Attending: Emergency Medicine | Admitting: Emergency Medicine

## 2013-04-27 ENCOUNTER — Encounter (HOSPITAL_COMMUNITY): Payer: Self-pay | Admitting: Emergency Medicine

## 2013-04-27 DIAGNOSIS — R319 Hematuria, unspecified: Secondary | ICD-10-CM | POA: Insufficient documentation

## 2013-04-27 DIAGNOSIS — R5381 Other malaise: Secondary | ICD-10-CM | POA: Insufficient documentation

## 2013-04-27 DIAGNOSIS — Z79899 Other long term (current) drug therapy: Secondary | ICD-10-CM | POA: Insufficient documentation

## 2013-04-27 DIAGNOSIS — IMO0002 Reserved for concepts with insufficient information to code with codable children: Secondary | ICD-10-CM | POA: Insufficient documentation

## 2013-04-27 DIAGNOSIS — R109 Unspecified abdominal pain: Secondary | ICD-10-CM | POA: Insufficient documentation

## 2013-04-27 DIAGNOSIS — F17200 Nicotine dependence, unspecified, uncomplicated: Secondary | ICD-10-CM | POA: Insufficient documentation

## 2013-04-27 DIAGNOSIS — R350 Frequency of micturition: Secondary | ICD-10-CM | POA: Insufficient documentation

## 2013-04-27 DIAGNOSIS — Z880 Allergy status to penicillin: Secondary | ICD-10-CM | POA: Insufficient documentation

## 2013-04-27 DIAGNOSIS — J45909 Unspecified asthma, uncomplicated: Secondary | ICD-10-CM | POA: Insufficient documentation

## 2013-04-27 DIAGNOSIS — R5383 Other fatigue: Secondary | ICD-10-CM

## 2013-04-27 LAB — COMPREHENSIVE METABOLIC PANEL
ALT: 7 U/L (ref 0–35)
AST: 13 U/L (ref 0–37)
Albumin: 3.5 g/dL (ref 3.5–5.2)
Alkaline Phosphatase: 59 U/L (ref 39–117)
BUN: 13 mg/dL (ref 6–23)
CALCIUM: 9.1 mg/dL (ref 8.4–10.5)
CO2: 27 mEq/L (ref 19–32)
CREATININE: 0.76 mg/dL (ref 0.50–1.10)
Chloride: 107 mEq/L (ref 96–112)
GFR calc non Af Amer: >90 mL/min (ref >90)
Glucose, Bld: 89 mg/dL (ref 70–99)
Potassium: 4.3 mEq/L (ref 3.7–5.3)
SODIUM: 144 meq/L (ref 137–147)
TOTAL PROTEIN: 7.1 g/dL (ref 6.0–8.3)
Total Bilirubin: 0.3 mg/dL (ref 0.3–1.2)

## 2013-04-27 LAB — URINALYSIS, ROUTINE W REFLEX MICROSCOPIC
BILIRUBIN URINE: NEGATIVE
Glucose, UA: NEGATIVE mg/dL
HGB URINE DIPSTICK: NEGATIVE
KETONES UR: NEGATIVE mg/dL
Leukocytes, UA: NEGATIVE
Nitrite: NEGATIVE
PROTEIN: NEGATIVE mg/dL
Specific Gravity, Urine: 1.025 (ref 1.005–1.030)
UROBILINOGEN UA: 0.2 mg/dL (ref 0.0–1.0)
pH: 6 (ref 5.0–8.0)

## 2013-04-27 LAB — CBC WITH DIFFERENTIAL/PLATELET
BASOS ABS: 0 10*3/uL (ref 0.0–0.1)
Basophils Relative: 1 % (ref 0–1)
EOS ABS: 0.1 10*3/uL (ref 0.0–0.7)
EOS PCT: 2 % (ref 0–5)
HCT: 39 % (ref 36.0–46.0)
Hemoglobin: 13.5 g/dL (ref 12.0–15.0)
Lymphocytes Relative: 43 % (ref 12–46)
Lymphs Abs: 1.8 10*3/uL (ref 0.7–4.0)
MCH: 31.3 pg (ref 26.0–34.0)
MCHC: 34.6 g/dL (ref 30.0–36.0)
MCV: 90.5 fL (ref 78.0–100.0)
Monocytes Absolute: 0.4 10*3/uL (ref 0.1–1.0)
Monocytes Relative: 9 % (ref 3–12)
Neutro Abs: 2 10*3/uL (ref 1.7–7.7)
Neutrophils Relative %: 46 % (ref 43–77)
PLATELETS: 135 10*3/uL — AB (ref 150–400)
RBC: 4.31 MIL/uL (ref 3.87–5.11)
RDW: 13.6 % (ref 11.5–15.5)
WBC: 4.3 10*3/uL (ref 4.0–10.5)

## 2013-04-27 LAB — LIPASE, BLOOD: Lipase: 63 U/L — ABNORMAL HIGH (ref 11–59)

## 2013-04-27 MED ORDER — OXYCODONE-ACETAMINOPHEN 5-325 MG PO TABS
2.0000 | ORAL_TABLET | ORAL | Status: DC | PRN
Start: 2013-04-27 — End: 2014-01-23

## 2013-04-27 MED ORDER — SODIUM CHLORIDE 0.9 % IV BOLUS (SEPSIS)
500.0000 mL | Freq: Once | INTRAVENOUS | Status: AC
  Administered 2013-04-27: 500 mL via INTRAVENOUS

## 2013-04-27 MED ORDER — PROMETHAZINE HCL 25 MG PO TABS: 25.0000 mg | ORAL_TABLET | Freq: Four times a day (QID) | ORAL | Status: DC | PRN

## 2013-04-27 NOTE — ED Provider Notes (Signed)
CSN: [id number]     Arrival date & time 04/27/13  1048 History  This chart was scribed for Alexis Christen, MD, by Neta Ehlers, ED Scribe. This patient was seen in room [facility] and the patient's care was started at 11:34 AM.   First MD Initiated Contact with Patient 04/27/13 1113     Chief Complaint  Patient presents with  . Back Pain    The history is provided by the patient. No language interpreter was used.    HPI Comments: Alexis Lucas is a 53 y.o. female who presents to the Emergency Department complaining of intermittent pain to her left flank, accompanied by hematuria, urinary frequency, and generalized weakness, which has persisted for two days. She characterizes the pain as "aching" and rates the pain as 6/10. The pt reports the pain was worse yesterday evening, prevening her from sleeping, and she treated the pain with 800 mg IBU. She reports she does not drink a lot of water, but that she drinks mainly coffee and [facility] The pt is a current smoker.    Past Medical History  Diagnosis Date  . Asthma    Past Surgical History  Procedure Laterality Date  . Abdominal hysterectomy     No family history on file. History  Substance Use Topics  . Smoking status: Current Every Day Smoker    Types: Cigarettes  . Smokeless tobacco: Not on file  . Alcohol Use: No   No OB history provided.  Review of Systems  Constitutional: Negative for fever.  Gastrointestinal: Negative for vomiting and diarrhea.  Genitourinary: Positive for frequency, hematuria and flank pain.  Neurological: Positive for weakness.  All other systems reviewed and are negative.   Allergies  Penicillins  Home Medications   Current Outpatient Rx  Name  Route  Sig  Dispense  Refill  . albuterol (PROVENTIL HFA;VENTOLIN HFA) 108 (90 BASE) MCG/ACT inhaler   Inhalation   Inhale 2 puffs into the lungs every 6 (six) hours as needed. For shortness of breath          . predniSONE (DELTASONE) 20 MG  tablet      Take 3 tablets PO x 3 days, take 2 tablets PO x 2 days, take 1 tablet PO x 1 day.   14 tablet   0   . sulfamethoxazole-trimethoprim (SEPTRA DS) 800-160 MG per tablet   Oral   Take 1 tablet by mouth 2 (two) times daily.   14 tablet   0    Triage Vitals: BP 127/71  Pulse 87  Temp(Src) 99.3 F (37.4 C)  Resp 18  SpO2 100%  Physical Exam  Nursing note and vitals reviewed. Constitutional: She is oriented to person, place, and time. She appears well-developed and well-nourished.  HENT:  Head: Normocephalic and atraumatic.  Eyes: Conjunctivae and EOM are normal. Pupils are equal, round, and reactive to light.  Neck: Normal range of motion. Neck supple.  Cardiovascular: Normal rate, regular rhythm and normal heart sounds.   Pulmonary/Chest: Effort normal and breath sounds normal.  Abdominal: Soft. Bowel sounds are normal.  No suprapubic tenderness.   Musculoskeletal: Normal range of motion. She exhibits tenderness.  Minimal left lower flank pain.   Neurological: She is alert and oriented to person, place, and time.  Skin: Skin is warm and dry.  Psychiatric: She has a normal mood and affect. Her behavior is normal.    ED Course  Procedures (including critical care time)  DIAGNOSTIC STUDIES: Oxygen Saturation is 100% on  room air, normal by my interpretation.    COORDINATION OF CARE:  11:39 AM- Discussed treatment plan with patient, and the patient agreed to the plan.   Labs Review Labs Reviewed  CBC WITH DIFFERENTIAL - Abnormal; Notable for the following:    Platelets 135 (*)    All other components within normal limits  LIPASE, BLOOD - Abnormal; Notable for the following:    Lipase 63 (*)    All other components within normal limits  URINALYSIS, ROUTINE W REFLEX MICROSCOPIC  COMPREHENSIVE METABOLIC PANEL   Imaging Review No results found.  EKG Interpretation   None       MDM  No diagnosis found. Urinalysis and white blood cell count normal.  No  acute abdomen. Lipase noted to be minimally elevated. Discharge medications Percocet and Phenergan 25 mg  I personally performed the services described in this documentation, which was scribed in my presence. The recorded information has been reviewed and is accurate.     Alexis Christen, MD 05/02/13 [phone]

## 2013-04-27 NOTE — ED Notes (Signed)
Left sided lower back pain with pink colored blood with wiping x 2 days.  Pt also reports increased fatigue and nausea.  Denies fever/v/d.  Reports urinary frequency.

## 2013-04-27 NOTE — Discharge Instructions (Signed)
Urinalysis was normal. Blood work was normal with the exception of a mildly elevated lipase which is a chemical associated with the pancreas.  Medication for pain and nausea. Return if worse

## 2013-05-26 ENCOUNTER — Other Ambulatory Visit (HOSPITAL_COMMUNITY): Payer: Self-pay | Admitting: Family Medicine

## 2013-05-26 ENCOUNTER — Other Ambulatory Visit: Payer: Self-pay | Admitting: Family Medicine

## 2013-05-26 DIAGNOSIS — N2889 Other specified disorders of kidney and ureter: Secondary | ICD-10-CM

## 2013-05-26 DIAGNOSIS — R1032 Left lower quadrant pain: Secondary | ICD-10-CM

## 2013-05-27 ENCOUNTER — Other Ambulatory Visit (HOSPITAL_COMMUNITY): Payer: Self-pay | Admitting: Family Medicine

## 2013-05-27 DIAGNOSIS — R1032 Left lower quadrant pain: Secondary | ICD-10-CM

## 2013-05-28 ENCOUNTER — Ambulatory Visit (HOSPITAL_COMMUNITY)
Admission: RE | Admit: 2013-05-28 | Discharge: 2013-05-28 | Disposition: A | Discharge status: Home / Self Care | Payer: No Typology Code available for payment source | Source: Ambulatory Visit | Attending: Family Medicine | Admitting: Family Medicine

## 2013-05-28 ENCOUNTER — Ambulatory Visit (HOSPITAL_COMMUNITY): Admission: RE | Admit: 2013-05-28 | Payer: No Typology Code available for payment source | Source: Ambulatory Visit

## 2013-05-28 DIAGNOSIS — N2889 Other specified disorders of kidney and ureter: Secondary | ICD-10-CM

## 2013-05-28 DIAGNOSIS — R109 Unspecified abdominal pain: Secondary | ICD-10-CM | POA: Insufficient documentation

## 2013-05-28 MED ORDER — GADOBENATE DIMEGLUMINE 529 MG/ML IV SOLN
12.0000 mL | Freq: Once | INTRAVENOUS | Status: AC | PRN
  Administered 2013-05-28: 12 mL via INTRAVENOUS

## 2013-05-31 ENCOUNTER — Ambulatory Visit (HOSPITAL_COMMUNITY)
Admission: RE | Admit: 2013-05-31 | Discharge: 2013-05-31 | Disposition: A | Discharge status: Home / Self Care | Payer: No Typology Code available for payment source | Source: Ambulatory Visit | Attending: Family Medicine | Admitting: Family Medicine

## 2013-05-31 DIAGNOSIS — Z9079 Acquired absence of other genital organ(s): Secondary | ICD-10-CM | POA: Insufficient documentation

## 2013-05-31 DIAGNOSIS — R1032 Left lower quadrant pain: Secondary | ICD-10-CM

## 2013-05-31 DIAGNOSIS — N949 Unspecified condition associated with female genital organs and menstrual cycle: Secondary | ICD-10-CM | POA: Insufficient documentation

## 2013-06-01 ENCOUNTER — Other Ambulatory Visit: Payer: No Typology Code available for payment source

## 2013-08-01 ENCOUNTER — Emergency Department (HOSPITAL_COMMUNITY)
Admission: EM | Admit: 2013-08-01 | Discharge: 2013-08-01 | Disposition: A | Discharge status: Home / Self Care | Payer: No Typology Code available for payment source | Attending: Emergency Medicine | Admitting: Emergency Medicine

## 2013-08-01 ENCOUNTER — Encounter (HOSPITAL_COMMUNITY): Payer: Self-pay | Admitting: Emergency Medicine

## 2013-08-01 DIAGNOSIS — S6990XA Unspecified injury of unspecified wrist, hand and finger(s), initial encounter: Secondary | ICD-10-CM | POA: Diagnosis not present

## 2013-08-01 DIAGNOSIS — S59909A Unspecified injury of unspecified elbow, initial encounter: Secondary | ICD-10-CM | POA: Diagnosis present

## 2013-08-01 DIAGNOSIS — X500XXA Overexertion from strenuous movement or load, initial encounter: Secondary | ICD-10-CM | POA: Diagnosis not present

## 2013-08-01 DIAGNOSIS — J45909 Unspecified asthma, uncomplicated: Secondary | ICD-10-CM | POA: Diagnosis not present

## 2013-08-01 DIAGNOSIS — F17200 Nicotine dependence, unspecified, uncomplicated: Secondary | ICD-10-CM | POA: Insufficient documentation

## 2013-08-01 DIAGNOSIS — M778 Other enthesopathies, not elsewhere classified: Secondary | ICD-10-CM

## 2013-08-01 DIAGNOSIS — M25532 Pain in left wrist: Secondary | ICD-10-CM

## 2013-08-01 DIAGNOSIS — M65839 Other synovitis and tenosynovitis, unspecified forearm: Secondary | ICD-10-CM | POA: Diagnosis not present

## 2013-08-01 DIAGNOSIS — Z79899 Other long term (current) drug therapy: Secondary | ICD-10-CM | POA: Insufficient documentation

## 2013-08-01 DIAGNOSIS — Y9389 Activity, other specified: Secondary | ICD-10-CM | POA: Insufficient documentation

## 2013-08-01 DIAGNOSIS — M65849 Other synovitis and tenosynovitis, unspecified hand: Secondary | ICD-10-CM | POA: Diagnosis not present

## 2013-08-01 DIAGNOSIS — Z880 Allergy status to penicillin: Secondary | ICD-10-CM | POA: Insufficient documentation

## 2013-08-01 DIAGNOSIS — Y929 Unspecified place or not applicable: Secondary | ICD-10-CM | POA: Insufficient documentation

## 2013-08-01 DIAGNOSIS — S59919A Unspecified injury of unspecified forearm, initial encounter: Principal | ICD-10-CM

## 2013-08-01 MED ORDER — HYDROCODONE-ACETAMINOPHEN 5-325 MG PO TABS: ORAL_TABLET | ORAL | Status: DC

## 2013-08-01 MED ORDER — DICLOFENAC SODIUM 75 MG PO TBEC: 75.0000 mg | DELAYED_RELEASE_TABLET | Freq: Two times a day (BID) | ORAL | Status: DC

## 2013-08-01 NOTE — Discharge Instructions (Signed)
Wrist Pain °A wrist sprain happens when the bands of tissue that hold the wrist joints together (ligament) stretch too much or tear. A wrist strain happens when muscles or bands of tissue that connect muscles to bones (tendons) are stretched or pulled. °HOME CARE °· Put ice on the injured area. °· Put ice in a plastic bag. °· Place a towel between your skin and the bag. °· Leave the ice on for 15-20 minutes, 03-04 times a day, for the first 2 days. °· Raise (elevate) the injured wrist to lessen puffiness (swelling). °· Rest the injured wrist for at least 48 hours or as told by your doctor. °· Wear a splint, cast, or an elastic wrap as told by your doctor. °· Only take medicine as told by your doctor. °· Follow up with your doctor as told. This is important. °GET HELP RIGHT AWAY IF:  °· The fingers are puffy, very red, white, or cold and blue. °· The fingers lose feeling (numb) or tingle. °· The pain gets worse. °· It is hard to move the fingers. °MAKE SURE YOU:  °· Understand these instructions. °· Will watch your condition. °· Will get help right away if you are not doing well or get worse. °Document Released: 09/18/2007 Document Revised: 06/24/2011 Document Reviewed: 05/23/2010 °[employer]® Patient Information ©2014 [employer]. ° °

## 2013-08-01 NOTE — ED Notes (Signed)
Pt c/o left wrist pain. Pt states she opened a box the other day and felt a pop in her wrist. Pt states it felt like she had hit her funny bone but the feeling was in her wrist.

## 2013-08-02 NOTE — ED Provider Notes (Signed)
Medical screening examination/treatment/procedure(s) were performed by non-physician practitioner and as supervising physician I was immediately available for consultation/collaboration.   EKG Interpretation None       Leighton Luster M Dagmawi Venable, MD 08/02/13 2207 

## 2013-08-03 MED FILL — Hydrocodone-Acetaminophen Tab 5-325 MG: ORAL | Qty: 6 | Status: AC

## 2013-08-04 NOTE — ED Provider Notes (Signed)
CSN: [id number]     Arrival date & time 08/01/13  2032 History   First MD Initiated Contact with Patient 08/01/13 2046     Chief Complaint  Patient presents with  . Wrist Pain     (Consider location/radiation/quality/duration/timing/severity/associated sxs/prior Treatment) Patient is a 53 y.o. female presenting with wrist pain. The history is provided by the patient.  Wrist Pain This is a new problem. The current episode started in the past 7 days. The problem occurs constantly. The problem has been unchanged. Associated symptoms include arthralgias and joint swelling. Pertinent negatives include no chills, fever, headaches, neck pain, numbness, rash, sore throat, vomiting or weakness. The symptoms are aggravated by twisting and bending. She has tried nothing for the symptoms. The treatment provided no relief.   Patient c/o pain and swelling of her left wrist for several days after she opened a box and felt a "pop" her wrist.  She also reports having a "pins and needles" sensation to her wrist that radiates into her forearm with palmar flexion of the wrist.  She denies redness, rash, neck pain or difficulty moving her elbow.    Past Medical History  Diagnosis Date  . Asthma    Past Surgical History  Procedure Laterality Date  . Abdominal hysterectomy     History reviewed. No pertinent family history. History  Substance Use Topics  . Smoking status: Current Every Day Smoker    Types: Cigarettes  . Smokeless tobacco: Not on file  . Alcohol Use: No   OB History   Grav Para Term Preterm Abortions TAB SAB Ect Mult Living                 Review of Systems  Constitutional: Negative for fever and chills.  HENT: Negative for sore throat.   Gastrointestinal: Negative for vomiting.  Genitourinary: Negative for dysuria and difficulty urinating.  Musculoskeletal: Positive for arthralgias and joint swelling. Negative for neck pain.  Skin: Negative for color change, rash and wound.   Neurological: Negative for weakness, numbness and headaches.  All other systems reviewed and are negative.     Allergies  Penicillins  Home Medications   Prior to Admission medications   Medication Sig Start Date End Date Taking? Authorizing Provider  albuterol (PROVENTIL HFA;VENTOLIN HFA) 108 (90 BASE) MCG/ACT inhaler Inhale 2 puffs into the lungs every 6 (six) hours as needed. For shortness of breath     Historical Provider, MD  diclofenac (VOLTAREN) 75 MG EC tablet Take 1 tablet (75 mg total) by mouth 2 (two) times daily. Take with food 08/01/13   Farzad Tibbetts L. Keshaun Dubey, PA-C  HYDROcodone-acetaminophen (NORCO/VICODIN) 5-325 MG per tablet Take one-two tabs po q 4-6 hrs prn pain 08/01/13   Loraine Freid L. Kimarie Coor, PA-C  HYDROcodone-acetaminophen (NORCO/VICODIN) 5-325 MG per tablet Take one-two tabs po q 4-6 hrs prn pain 08/01/13   Mingo Siegert L. Jocee Kissick, PA-C  ibuprofen (ADVIL,MOTRIN) 200 MG tablet Take 800 mg by mouth every 8 (eight) hours as needed for headache.    Historical Provider, MD  oxyCODONE-acetaminophen (PERCOCET) 5-325 MG per tablet Take 2 tablets by mouth every 4 (four) hours as needed. 04/27/13   [name], MD  promethazine (PHENERGAN) 25 MG tablet Take 1 tablet (25 mg total) by mouth every 6 (six) hours as needed. 04/27/13   [name], MD   BP 175/69  Pulse 67  Temp(Src) 98 F (36.7 C) (Oral)  Resp 20  Ht 5\' 2"  (1.575 m)  Wt 138 lb (62.596 kg)  BMI  25.23 kg/m2  SpO2 100% Physical Exam  Nursing note and vitals reviewed. Constitutional: She is oriented to person, place, and time. She appears well-developed and well-nourished. No distress.  HENT:  Head: Normocephalic and atraumatic.  Cardiovascular: Normal rate, regular rhythm, normal heart sounds and intact distal pulses.   Pulmonary/Chest: Effort normal and breath sounds normal. No respiratory distress.  Musculoskeletal: She exhibits tenderness. She exhibits no edema.  ttp of the medial left wrist.  Small, firm, mobile nodule  present.   Radial pulse is brisk, distal sensation intact.  CR< 2 sec.  No bruising or bony deformity.  Patient has full ROM. Compartments soft.  Neurological: She is alert and oriented to person, place, and time. She exhibits normal muscle tone. Coordination normal.  Skin: Skin is warm and dry.    ED Course  Procedures (including critical care time) Labs Review Labs Reviewed - No data to display  Imaging Review No results found.   EKG Interpretation None      MDM   Final diagnoses:  Wrist pain, left  Tendonitis of wrist, left    Thumb spica applied.  Pain improved, remains NV intact.    No hx of trauma to indicate need for imaging at this time.  No bony tenderness.  Probable ganglion cyst and tendonitis of the wrist.  No concerning sx's for infectious process.  Pt agrees to symptomatic treatment and referral for ortho if not improving.  She appears stable for discharge and agrees to plan    Avanish Cerullo L. Anterrio Mccleery, PA-C 08/04/13 1154  Aahna Rossa L. Leigh Kaeding, PA-C 08/04/13 1155

## 2013-08-05 NOTE — ED Provider Notes (Signed)
Medical screening examination/treatment/procedure(s) were performed by non-physician practitioner and as supervising physician I was immediately available for consultation/collaboration.   EKG Interpretation None       Babette Relic, MD 08/05/13 [phone]

## 2014-01-23 ENCOUNTER — Encounter (HOSPITAL_COMMUNITY): Payer: Self-pay | Admitting: Emergency Medicine

## 2014-01-23 ENCOUNTER — Emergency Department (HOSPITAL_COMMUNITY)
Admission: EM | Admit: 2014-01-23 | Discharge: 2014-01-23 | Disposition: A | Discharge status: Home / Self Care | Payer: No Typology Code available for payment source | Attending: Emergency Medicine | Admitting: Emergency Medicine

## 2014-01-23 DIAGNOSIS — R519 Headache, unspecified: Secondary | ICD-10-CM

## 2014-01-23 DIAGNOSIS — R42 Dizziness and giddiness: Secondary | ICD-10-CM | POA: Insufficient documentation

## 2014-01-23 DIAGNOSIS — Z791 Long term (current) use of non-steroidal anti-inflammatories (NSAID): Secondary | ICD-10-CM | POA: Insufficient documentation

## 2014-01-23 DIAGNOSIS — H538 Other visual disturbances: Secondary | ICD-10-CM | POA: Diagnosis not present

## 2014-01-23 DIAGNOSIS — G8929 Other chronic pain: Secondary | ICD-10-CM | POA: Insufficient documentation

## 2014-01-23 DIAGNOSIS — Z720 Tobacco use: Secondary | ICD-10-CM | POA: Insufficient documentation

## 2014-01-23 DIAGNOSIS — J45909 Unspecified asthma, uncomplicated: Secondary | ICD-10-CM | POA: Insufficient documentation

## 2014-01-23 DIAGNOSIS — Z880 Allergy status to penicillin: Secondary | ICD-10-CM | POA: Diagnosis not present

## 2014-01-23 DIAGNOSIS — Z79899 Other long term (current) drug therapy: Secondary | ICD-10-CM | POA: Diagnosis not present

## 2014-01-23 DIAGNOSIS — R51 Headache: Secondary | ICD-10-CM | POA: Diagnosis not present

## 2014-01-23 HISTORY — DX: Headache, unspecified: R51.9

## 2014-01-23 HISTORY — DX: Other chronic pain: G89.29

## 2014-01-23 HISTORY — DX: Headache: R51

## 2014-01-23 MED ORDER — METOCLOPRAMIDE HCL 5 MG/ML IJ SOLN
10.0000 mg | Freq: Once | INTRAMUSCULAR | Status: AC
  Administered 2014-01-23: 10 mg via INTRAVENOUS
  Filled 2014-01-23: qty 2

## 2014-01-23 MED ORDER — DEXAMETHASONE SODIUM PHOSPHATE 10 MG/ML IJ SOLN
10.0000 mg | Freq: Once | INTRAMUSCULAR | Status: AC
  Administered 2014-01-23: 10 mg via INTRAVENOUS
  Filled 2014-01-23: qty 1

## 2014-01-23 MED ORDER — DIPHENHYDRAMINE HCL 50 MG/ML IJ SOLN
25.0000 mg | Freq: Once | INTRAMUSCULAR | Status: AC
  Administered 2014-01-23: 25 mg via INTRAVENOUS
  Filled 2014-01-23: qty 1

## 2014-01-23 MED ORDER — KETOROLAC TROMETHAMINE 30 MG/ML IJ SOLN
30.0000 mg | Freq: Once | INTRAMUSCULAR | Status: AC
  Administered 2014-01-23: 30 mg via INTRAVENOUS
  Filled 2014-01-23: qty 1

## 2014-01-23 MED ORDER — SODIUM CHLORIDE 0.9 % IV BOLUS (SEPSIS)
1000.0000 mL | Freq: Once | INTRAVENOUS | Status: AC
  Administered 2014-01-23: 1000 mL via INTRAVENOUS

## 2014-01-23 NOTE — Discharge Instructions (Signed)
Ibuprofen 600 mg every 6 hours as needed for pain.   General Headache Without Cause A headache is pain or discomfort felt around the head or neck area. The specific cause of a headache may not be found. There are many causes and types of headaches. A few common ones are:  Tension headaches.  Migraine headaches.  Cluster headaches.  Chronic daily headaches. HOME CARE INSTRUCTIONS   Keep all follow-up appointments with your caregiver or any specialist referral.  Only take over-the-counter or prescription medicines for pain or discomfort as directed by your caregiver.  Lie down in a dark, quiet room when you have a headache.  Keep a headache journal to find out what may trigger your migraine headaches. For example, write down:  What you eat and drink.  How much sleep you get.  Any change to your diet or medicines.  Try massage or other relaxation techniques.  Put ice packs or heat on the head and neck. Use these 3 to 4 times per day for 15 to 20 minutes each time, or as needed.  Limit stress.  Sit up straight, and do not tense your muscles.  Quit smoking if you smoke.  Limit alcohol use.  Decrease the amount of caffeine you drink, or stop drinking caffeine.  Eat and sleep on a regular schedule.  Get 7 to 9 hours of sleep, or as recommended by your caregiver.  Keep lights dim if bright lights bother you and make your headaches worse. SEEK MEDICAL CARE IF:   You have problems with the medicines you were prescribed.  Your medicines are not working.  You have a change from the usual headache.  You have nausea or vomiting. SEEK IMMEDIATE MEDICAL CARE IF:   Your headache becomes severe.  You have a fever.  You have a stiff neck.  You have loss of vision.  You have muscular weakness or loss of muscle control.  You start losing your balance or have trouble walking.  You feel faint or pass out.  You have severe symptoms that are different from your first  symptoms. MAKE SURE YOU:   Understand these instructions.  Will watch your condition.  Will get help right away if you are not doing well or get worse. Document Released: 04/01/2005 Document Revised: 06/24/2011 Document Reviewed: 04/17/2011 [facility] Patient Information 2015 [place], [place]. This information is not intended to replace advice given to you by your health care provider. Make sure you discuss any questions you have with your health care provider.

## 2014-01-23 NOTE — ED Provider Notes (Signed)
CSN: [id number]     Arrival date & time 01/23/14  1827 History  This chart was scribed for Veryl Speak, MD by Edison Simon, ED Scribe. This patient was seen in room APA03/[facility]03 and the patient's care was started at 8:22 PM.    Chief Complaint  Patient presents with  . Headache   Patient is a 53 y.o. female presenting with headaches. The history is provided by the patient. No language interpreter was used.  Headache Pain location:  Generalized Radiates to:  Does not radiate Timing:  Constant Chronicity:  New Similar to prior headaches: no   Relieved by: Hydrocodone. Worsened by:  Nothing tried Ineffective treatments:  None tried Associated symptoms: blurred vision and dizziness   Associated symptoms: no fever, no focal weakness, no nausea and no vomiting    HPI Comments: Alexis Lucas is a 52 y.o. female who presents to the Emergency Department complaining of headache with onset 3 days ago. She states she does not usually get headaches. She states the pain is diffuse throughout her head. She reports some associated visual disturbance today and dizziness. She denies recent head injury. She reports using Hydrocodone at home, which she is prescribed for her arm, with only temporary relief. She reports recently starting to use a new inhaler 4 days ago. She denies fever, nausea, or vomiting. She denies new weakness of numbness.  Past Medical History  Diagnosis Date  . Asthma   . Chronic headaches    Past Surgical History  Procedure Laterality Date  . Abdominal hysterectomy     History reviewed. No pertinent family history. History  Substance Use Topics  . Smoking status: Current Every Day Smoker    Types: Cigarettes  . Smokeless tobacco: Not on file  . Alcohol Use: No   OB History   Grav Para Term Preterm Abortions TAB SAB Ect Mult Living                 Review of Systems  Constitutional: Negative for fever.  Eyes: Positive for blurred vision.  Gastrointestinal: Negative for  nausea and vomiting.  Neurological: Positive for dizziness and headaches. Negative for focal weakness.  All other systems reviewed and are negative. A complete 10 system review of systems was obtained and all systems are negative except as noted in the HPI and PMH.    Allergies  Penicillins  Home Medications   Prior to Admission medications   Medication Sig Start Date End Date Taking? Authorizing Provider  albuterol (PROVENTIL HFA;VENTOLIN HFA) 108 (90 BASE) MCG/ACT inhaler Inhale 2 puffs into the lungs every 6 (six) hours as needed. For shortness of breath     Historical Provider, MD  diclofenac (VOLTAREN) 75 MG EC tablet Take 1 tablet (75 mg total) by mouth 2 (two) times daily. Take with food 08/01/13   Tammy L. Triplett, PA-C  ibuprofen (ADVIL,MOTRIN) 200 MG tablet Take 800 mg by mouth every 8 (eight) hours as needed for headache.    Historical Provider, MD   BP 151/73  Pulse 71  Temp(Src) 98.5 F (36.9 C) (Oral)  Resp 20  Ht 5\' 2"  (1.575 m)  Wt 137 lb (62.143 kg)  BMI 25.05 kg/m2  SpO2 100% Physical Exam  Nursing note and vitals reviewed. Constitutional: She is oriented to person, place, and time. She appears well-developed and well-nourished.  HENT:  Head: Normocephalic and atraumatic.  Eyes: Conjunctivae and EOM are normal. Pupils are equal, round, and reactive to light.  No papilledema  Neck: Normal range  of motion. Neck supple.  Cardiovascular: Normal rate, regular rhythm and normal heart sounds.   No murmur heard. Pulmonary/Chest: Effort normal. No respiratory distress. She has no wheezes. She has no rales.  Musculoskeletal: Normal range of motion.  Neurological: She is alert and oriented to person, place, and time. No cranial nerve deficit. She exhibits normal muscle tone. Coordination normal.  Skin: Skin is warm and dry.  Psychiatric: She has a normal mood and affect.    ED Course  Procedures (including critical care time) Labs Review Labs Reviewed - No data to  display  Imaging Review No results found.   EKG Interpretation None     DIAGNOSTIC STUDIES: Oxygen Saturation is 100% on room air, normal by my interpretation.    COORDINATION OF CARE: 8:31 PM Discussed treatment plan with patient at beside, including migraine medication. The patient agrees with the plan and has no further questions at this time.    MDM   Final diagnoses:  None    Patient feeling better with medications given. Her neurologic exam is nonfocal and I see no indication for further workup at this time. She will be discharged to home, to return as needed for any problems.  I personally performed the services described in this documentation, which was scribed in my presence. The recorded information has been reviewed and is accurate.      Veryl Speak, MD 01/23/14 2142

## 2014-01-23 NOTE — ED Notes (Signed)
Pt c/o headache and dizziness since Wednesday. Pt states "I just feel bad."

## 2014-01-23 NOTE — ED Notes (Signed)
Patient states that she is not having any pain at this time.

## 2014-08-11 ENCOUNTER — Emergency Department (HOSPITAL_COMMUNITY)
Admission: EM | Admit: 2014-08-11 | Discharge: 2014-08-11 | Disposition: A | Discharge status: Home / Self Care | Payer: No Typology Code available for payment source | Attending: Emergency Medicine | Admitting: Emergency Medicine

## 2014-08-11 ENCOUNTER — Encounter (HOSPITAL_COMMUNITY): Payer: Self-pay

## 2014-08-11 ENCOUNTER — Emergency Department (HOSPITAL_COMMUNITY): Payer: No Typology Code available for payment source

## 2014-08-11 DIAGNOSIS — Z880 Allergy status to penicillin: Secondary | ICD-10-CM | POA: Insufficient documentation

## 2014-08-11 DIAGNOSIS — Z720 Tobacco use: Secondary | ICD-10-CM | POA: Insufficient documentation

## 2014-08-11 DIAGNOSIS — R109 Unspecified abdominal pain: Secondary | ICD-10-CM

## 2014-08-11 DIAGNOSIS — Z79899 Other long term (current) drug therapy: Secondary | ICD-10-CM | POA: Insufficient documentation

## 2014-08-11 DIAGNOSIS — G8929 Other chronic pain: Secondary | ICD-10-CM | POA: Insufficient documentation

## 2014-08-11 DIAGNOSIS — J45909 Unspecified asthma, uncomplicated: Secondary | ICD-10-CM | POA: Insufficient documentation

## 2014-08-11 DIAGNOSIS — Z90710 Acquired absence of both cervix and uterus: Secondary | ICD-10-CM | POA: Insufficient documentation

## 2014-08-11 DIAGNOSIS — R1032 Left lower quadrant pain: Secondary | ICD-10-CM | POA: Insufficient documentation

## 2014-08-11 LAB — URINALYSIS, ROUTINE W REFLEX MICROSCOPIC
Bilirubin Urine: NEGATIVE
Glucose, UA: NEGATIVE mg/dL
Hgb urine dipstick: NEGATIVE
KETONES UR: NEGATIVE mg/dL
LEUKOCYTES UA: NEGATIVE
NITRITE: NEGATIVE
PH: 6 (ref 5.0–8.0)
Protein, ur: NEGATIVE mg/dL
Urobilinogen, UA: 1 mg/dL (ref 0.0–1.0)

## 2014-08-11 LAB — BASIC METABOLIC PANEL
Anion gap: 6 (ref 5–15)
BUN: 10 mg/dL (ref 6–23)
CO2: 28 mmol/L (ref 19–32)
Calcium: 9.1 mg/dL (ref 8.4–10.5)
Chloride: 105 mmol/L (ref 96–112)
Creatinine, Ser: 0.73 mg/dL (ref 0.50–1.10)
GFR calc Af Amer: >90 mL/min (ref >90)
GFR calc non Af Amer: >90 mL/min (ref >90)
GLUCOSE: 109 mg/dL — AB (ref 70–99)
Potassium: 3.3 mmol/L — ABNORMAL LOW (ref 3.5–5.1)
Sodium: 139 mmol/L (ref 135–145)

## 2014-08-11 LAB — CBC WITH DIFFERENTIAL/PLATELET
BASOS ABS: 0 10*3/uL (ref 0.0–0.1)
Basophils Relative: 1 % (ref 0–1)
EOS PCT: 3 % (ref 0–5)
Eosinophils Absolute: 0.2 10*3/uL (ref 0.0–0.7)
HCT: 41 % (ref 36.0–46.0)
Hemoglobin: 13.9 g/dL (ref 12.0–15.0)
Lymphocytes Relative: 48 % — ABNORMAL HIGH (ref 12–46)
Lymphs Abs: 3.3 10*3/uL (ref 0.7–4.0)
MCH: 31 pg (ref 26.0–34.0)
MCHC: 33.9 g/dL (ref 30.0–36.0)
MCV: 91.5 fL (ref 78.0–100.0)
Monocytes Absolute: 0.5 10*3/uL (ref 0.1–1.0)
Monocytes Relative: 8 % (ref 3–12)
NEUTROS ABS: 2.8 10*3/uL (ref 1.7–7.7)
Neutrophils Relative %: 40 % — ABNORMAL LOW (ref 43–77)
Platelets: 188 10*3/uL (ref 150–400)
RBC: 4.48 MIL/uL (ref 3.87–5.11)
RDW: 14 % (ref 11.5–15.5)
WBC: 6.8 10*3/uL (ref 4.0–10.5)

## 2014-08-11 MED ORDER — SODIUM CHLORIDE 0.9 % IV BOLUS (SEPSIS): 1000.0000 mL | Freq: Once | INTRAVENOUS | Status: DC

## 2014-08-11 MED ORDER — HYDROCODONE-ACETAMINOPHEN 5-325 MG PO TABS: 1.0000 | ORAL_TABLET | Freq: Four times a day (QID) | ORAL | Status: DC | PRN

## 2014-08-11 MED ORDER — SODIUM CHLORIDE 0.9 % IV SOLN
INTRAVENOUS | Status: DC
  Administered 2014-08-11: 21:00:00 via INTRAVENOUS

## 2014-08-11 MED ORDER — IOHEXOL 300 MG/ML  SOLN
100.0000 mL | Freq: Once | INTRAMUSCULAR | Status: AC | PRN
  Administered 2014-08-11: 100 mL via INTRAVENOUS

## 2014-08-11 MED ORDER — ONDANSETRON HCL 4 MG/2ML IJ SOLN
4.0000 mg | Freq: Once | INTRAMUSCULAR | Status: AC
  Administered 2014-08-11: 4 mg via INTRAVENOUS
  Filled 2014-08-11: qty 2

## 2014-08-11 MED ORDER — SODIUM CHLORIDE 0.9 % IV SOLN: INTRAVENOUS | Status: DC

## 2014-08-11 NOTE — ED Notes (Signed)
Pt verbalized understanding of no driving and to use caution within 4 hours of taking pain meds due to meds cause drowsiness 

## 2014-08-11 NOTE — ED Notes (Signed)
Pt c/o left flank and left abd pain x 1 week.  Reports intermittent pain with urination.   Denies abnormal vaginal bleeding or discharge.

## 2014-08-11 NOTE — ED Provider Notes (Addendum)
CSN: [id number]     Arrival date & time 08/11/14  1848 History   First MD Initiated Contact with Patient 08/11/14 1857     Chief Complaint  Patient presents with  . Abdominal Pain  . Flank Pain     (Consider location/radiation/quality/duration/timing/severity/associated sxs/prior Treatment) Patient is a 54 y.o. female presenting with abdominal pain and flank pain. The history is provided by the patient.  Abdominal Pain Associated symptoms: dysuria   Associated symptoms: no chest pain, no diarrhea, no fever, no nausea, no shortness of breath, no vaginal bleeding, no vaginal discharge and no vomiting   Flank Pain Associated symptoms include abdominal pain. Pertinent negatives include no chest pain, no headaches and no shortness of breath.   patient with onset of left lower quadrant abdominal pain on Saturday which would be 5 days ago. Pain is been constant. It's associated with occasional intermittent discomfort with urination. No fever no nausea no vomiting no back pain. History of similar pain in the past but they weren't able to figure out what it was from. Patient's record Dr. is Dr. Berdine Addison.  Past Medical History  Diagnosis Date  . Asthma   . Chronic headaches    Past Surgical History  Procedure Laterality Date  . Abdominal hysterectomy     No family history on file. History  Substance Use Topics  . Smoking status: Current Every Day Smoker    Types: Cigarettes  . Smokeless tobacco: Not on file  . Alcohol Use: No   OB History    No data available     Review of Systems  Constitutional: Negative for fever.  HENT: Negative for congestion.   Eyes: Negative for redness.  Respiratory: Negative for shortness of breath.   Cardiovascular: Negative for chest pain.  Gastrointestinal: Positive for abdominal pain. Negative for nausea, vomiting and diarrhea.  Genitourinary: Positive for dysuria and flank pain. Negative for vaginal bleeding and vaginal discharge.  Musculoskeletal:  Negative for back pain.  Skin: Negative for rash.  Neurological: Negative for headaches.  Hematological: Does not bruise/bleed easily.  Psychiatric/Behavioral: Negative for confusion.      Allergies  Penicillins  Home Medications   Prior to Admission medications   Medication Sig Start Date End Date Taking? Authorizing Provider  acetaminophen (TYLENOL) 500 MG tablet Take 1,500 mg by mouth every 6 (six) hours as needed.   Yes Historical Provider, MD  albuterol (PROVENTIL HFA;VENTOLIN HFA) 108 (90 BASE) MCG/ACT inhaler Inhale 2 puffs into the lungs every 6 (six) hours as needed. For shortness of breath    Yes Historical Provider, MD  HYDROcodone-acetaminophen (NORCO/VICODIN) 5-325 MG per tablet Take 1 tablet by mouth every 6 (six) hours as needed for moderate pain.   Yes Historical Provider, MD  ibuprofen (ADVIL,MOTRIN) 200 MG tablet Take 800 mg by mouth every 6 (six) hours as needed for mild pain or moderate pain.   Yes Historical Provider, MD  Tiotropium Bromide-Olodaterol (STIOLTO RESPIMAT) 2.5-2.5 MCG/ACT AERS Inhale 2 puffs into the lungs 2 (two) times daily.   Yes Historical Provider, MD   BP 171/108 mmHg  Pulse 87  Temp(Src) 98.5 F (36.9 C) (Oral)  Resp 16  Ht 5\' 2"  (1.575 m)  Wt 140 lb (63.504 kg)  BMI 25.60 kg/m2  SpO2 100% Physical Exam  Constitutional: She is oriented to person, place, and time. She appears well-developed and well-nourished. No distress.  HENT:  Head: Normocephalic and atraumatic.  Mouth/Throat: Oropharynx is clear and moist.  Eyes: Conjunctivae and EOM are normal. Pupils  are equal, round, and reactive to light.  Neck: Normal range of motion.  Cardiovascular: Normal rate, regular rhythm and normal heart sounds.   No murmur heard. Pulmonary/Chest: Effort normal and breath sounds normal. No respiratory distress.  Abdominal: Soft. Bowel sounds are normal. There is tenderness. There is no guarding.  Musculoskeletal: Normal range of motion.   Neurological: She is alert and oriented to person, place, and time. No cranial nerve deficit. She exhibits normal muscle tone. Coordination normal.  Skin: Skin is warm. No rash noted.  Nursing note and vitals reviewed.   ED Course  Procedures (including critical care time) Labs Review Labs Reviewed  CBC WITH DIFFERENTIAL/PLATELET - Abnormal; Notable for the following:    Neutrophils Relative % 40 (*)    Lymphocytes Relative 48 (*)    All other components within normal limits  BASIC METABOLIC PANEL - Abnormal; Notable for the following:    Potassium 3.3 (*)    Glucose, Bld 109 (*)    All other components within normal limits  URINALYSIS, ROUTINE W REFLEX MICROSCOPIC   Results for orders placed or performed during the hospital encounter of 08/11/14  CBC with Differential  Result Value Ref Range   WBC 6.8 4.0 - 10.5 K/uL   RBC 4.48 3.87 - 5.11 MIL/uL   Hemoglobin 13.9 12.0 - 15.0 g/dL   HCT 41.0 36.0 - 46.0 %   MCV 91.5 78.0 - 100.0 fL   MCH 31.0 26.0 - 34.0 pg   MCHC 33.9 30.0 - 36.0 g/dL   RDW 14.0 11.5 - 15.5 %   Platelets 188 150 - 400 K/uL   Neutrophils Relative % 40 (L) 43 - 77 %   Neutro Abs 2.8 1.7 - 7.7 K/uL   Lymphocytes Relative 48 (H) 12 - 46 %   Lymphs Abs 3.3 0.7 - 4.0 K/uL   Monocytes Relative 8 3 - 12 %   Monocytes Absolute 0.5 0.1 - 1.0 K/uL   Eosinophils Relative 3 0 - 5 %   Eosinophils Absolute 0.2 0.0 - 0.7 K/uL   Basophils Relative 1 0 - 1 %   Basophils Absolute 0.0 0.0 - 0.1 K/uL  Basic metabolic panel  Result Value Ref Range   Sodium 139 135 - 145 mmol/L   Potassium 3.3 (L) 3.5 - 5.1 mmol/L   Chloride 105 96 - 112 mmol/L   CO2 28 19 - 32 mmol/L   Glucose, Bld 109 (H) 70 - 99 mg/dL   BUN 10 6 - 23 mg/dL   Creatinine, Ser 0.73 0.50 - 1.10 mg/dL   Calcium 9.1 8.4 - 10.5 mg/dL   GFR calc non Af Amer >90 >90 mL/min   GFR calc Af Amer >90 >90 mL/min   Anion gap 6 5 - 15     Imaging Review Ct Abdomen Pelvis W Contrast  08/11/2014   CLINICAL  DATA:  Left flank pain left abdominal pain for 1 week. Left ectopic kidney. Intermittent pain urination.  EXAM: CT ABDOMEN AND PELVIS WITH CONTRAST  TECHNIQUE: Multidetector CT imaging of the abdomen and pelvis was performed using the standard protocol following bolus administration of intravenous contrast.  CONTRAST:  149mL OMNIPAQUE IOHEXOL 300 MG/ML  SOLN  COMPARISON:  CT 07/19/2009  FINDINGS: Lower chest: Lung bases are clear.  Hepatobiliary: No focal hepatic lesion. No biliary duct dilatation. Gallbladder is normal. Common bile duct is normal.  Pancreas: Pancreas is normal. No ductal dilatation. No pancreatic inflammation.  Spleen: Normal spleen  Adrenals/urinary tract: Adrenal glands are  normal. The right kidney is normal. No evidence of obstruction. The left kidney is malpositioned in the mid to left pelvis. The medial portion of the ectopic left kidney is mild malformed. No hydronephrosis of the left kidney. The left ureter is difficult to follow. Right ureter appears normal. There is no bladder calculi. On the delayed pyelogram phase imaging, there is no hydronephrosis or obstruction.  Stomach/Bowel: Stomach, small bowel, appendix, and cecum are normal. The colon and rectosigmoid colon are normal.  Vascular/Lymphatic: Mild atherosclerotic calcification aorta. No retroperitoneal periportal lymphadenopathy  Reproductive: Post hysterectomy.  Musculoskeletal: No aggressive osseous lesion.  Other: No free fluid.  IMPRESSION: 1. Ectopic left kidney within the pelvis. No evidence of obstruction of the ectopic kidney. 2. The right kidney appears normal. 3. No bladder calculi. 4. Normal appendix. 5. No diverticulosis.   Electronically Signed   By: Suzy Bouchard M.D.   On: 08/11/2014 20:53     EKG Interpretation None      MDM   Final diagnoses:  Abdominal pain   The patient with complaint of left lower quadrant abdominal pain for a week. Patient does report some intermittent discomfort with urination.  No abnormal vaginal bleeding or discharge. Patient had similar pain to this in the past but they weren't able to figure out what it was. Symptoms started on Saturday. So actually more for 5 days. No nausea no vomiting no fever. No back pain.   CT scan without any significant findings other than the patient has an ectopic left kidney within the pelvis. No evidence of any obstruction of the ectopic kidney. The right kidney appears normal. Appendix is normal as well and is no diverticulosis.  Patient without a leukocytosis no significant lab abnormalities. Kidney function is normal. If the patient's urinalysis is negative. Patient can be discharged with pain control and follow-up with her doctor.  Urinalysis negative other than urine being very concentrated. No evidence urinary tract infection no hematuria.   Fredia Sorrow, MD 08/11/14 5102  Fredia Sorrow, MD 08/11/14 5852  Fredia Sorrow, MD 08/11/14 2150

## 2014-08-11 NOTE — Discharge Instructions (Signed)
Evaluation for the abdominal pain with negative CAT scan other than showing a left-sided kidney in an unusual location. Take pain medicine as directed. Follow-up with your doctor in the next few days. Return for any new or worse symptoms.

## 2014-08-24 ENCOUNTER — Emergency Department (HOSPITAL_COMMUNITY): Payer: Self-pay

## 2014-08-24 ENCOUNTER — Emergency Department (HOSPITAL_COMMUNITY)
Admission: EM | Admit: 2014-08-24 | Discharge: 2014-08-24 | Disposition: A | Discharge status: Home / Self Care | Payer: No Typology Code available for payment source | Attending: Emergency Medicine | Admitting: Emergency Medicine

## 2014-08-24 ENCOUNTER — Encounter (HOSPITAL_COMMUNITY): Payer: Self-pay | Admitting: Emergency Medicine

## 2014-08-24 ENCOUNTER — Emergency Department (HOSPITAL_COMMUNITY): Payer: No Typology Code available for payment source

## 2014-08-24 DIAGNOSIS — J45909 Unspecified asthma, uncomplicated: Secondary | ICD-10-CM | POA: Insufficient documentation

## 2014-08-24 DIAGNOSIS — Z720 Tobacco use: Secondary | ICD-10-CM | POA: Insufficient documentation

## 2014-08-24 DIAGNOSIS — R109 Unspecified abdominal pain: Secondary | ICD-10-CM

## 2014-08-24 DIAGNOSIS — R102 Pelvic and perineal pain: Secondary | ICD-10-CM

## 2014-08-24 DIAGNOSIS — A5901 Trichomonal vulvovaginitis: Secondary | ICD-10-CM | POA: Insufficient documentation

## 2014-08-24 DIAGNOSIS — Z880 Allergy status to penicillin: Secondary | ICD-10-CM | POA: Insufficient documentation

## 2014-08-24 DIAGNOSIS — Z3202 Encounter for pregnancy test, result negative: Secondary | ICD-10-CM | POA: Insufficient documentation

## 2014-08-24 DIAGNOSIS — A599 Trichomoniasis, unspecified: Secondary | ICD-10-CM

## 2014-08-24 DIAGNOSIS — G8929 Other chronic pain: Secondary | ICD-10-CM | POA: Insufficient documentation

## 2014-08-24 DIAGNOSIS — Z90710 Acquired absence of both cervix and uterus: Secondary | ICD-10-CM | POA: Insufficient documentation

## 2014-08-24 LAB — URINALYSIS, ROUTINE W REFLEX MICROSCOPIC
Bilirubin Urine: NEGATIVE
GLUCOSE, UA: NEGATIVE mg/dL
Hgb urine dipstick: NEGATIVE
LEUKOCYTES UA: NEGATIVE
NITRITE: NEGATIVE
PROTEIN: NEGATIVE mg/dL
Specific Gravity, Urine: >1.03 — ABNORMAL HIGH (ref 1.005–1.030)
Urobilinogen, UA: 0.2 mg/dL (ref 0.0–1.0)
pH: 5.5 (ref 5.0–8.0)

## 2014-08-24 LAB — COMPREHENSIVE METABOLIC PANEL
ALT: 9 U/L — ABNORMAL LOW (ref 14–54)
AST: 16 U/L (ref 15–41)
Albumin: 4.3 g/dL (ref 3.5–5.0)
Alkaline Phosphatase: 76 U/L (ref 38–126)
Anion gap: 6 (ref 5–15)
BUN: 10 mg/dL (ref 6–20)
CALCIUM: 9.6 mg/dL (ref 8.9–10.3)
CO2: 28 mmol/L (ref 22–32)
Chloride: 106 mmol/L (ref 101–111)
Creatinine, Ser: 0.79 mg/dL (ref 0.44–1.00)
GLUCOSE: 94 mg/dL (ref 70–99)
Potassium: 4 mmol/L (ref 3.5–5.1)
Sodium: 140 mmol/L (ref 135–145)
Total Bilirubin: 0.4 mg/dL (ref 0.3–1.2)
Total Protein: 8.2 g/dL — ABNORMAL HIGH (ref 6.5–8.1)

## 2014-08-24 LAB — CBC WITH DIFFERENTIAL/PLATELET
BASOS ABS: 0 10*3/uL (ref 0.0–0.1)
Basophils Relative: 1 % (ref 0–1)
Eosinophils Absolute: 0.2 10*3/uL (ref 0.0–0.7)
Eosinophils Relative: 3 % (ref 0–5)
HEMATOCRIT: 42 % (ref 36.0–46.0)
Hemoglobin: 14.2 g/dL (ref 12.0–15.0)
LYMPHS PCT: 39 % (ref 12–46)
Lymphs Abs: 2.1 10*3/uL (ref 0.7–4.0)
MCH: 30.7 pg (ref 26.0–34.0)
MCHC: 33.8 g/dL (ref 30.0–36.0)
MCV: 90.7 fL (ref 78.0–100.0)
MONO ABS: 0.4 10*3/uL (ref 0.1–1.0)
Monocytes Relative: 7 % (ref 3–12)
Neutro Abs: 2.7 10*3/uL (ref 1.7–7.7)
Neutrophils Relative %: 50 % (ref 43–77)
Platelets: 200 10*3/uL (ref 150–400)
RBC: 4.63 MIL/uL (ref 3.87–5.11)
RDW: 13.9 % (ref 11.5–15.5)
WBC: 5.3 10*3/uL (ref 4.0–10.5)

## 2014-08-24 LAB — WET PREP, GENITAL
CLUE CELLS WET PREP: NONE SEEN
Yeast Wet Prep HPF POC: NONE SEEN

## 2014-08-24 LAB — PREGNANCY, URINE: PREG TEST UR: NEGATIVE

## 2014-08-24 MED ORDER — METRONIDAZOLE 500 MG PO TABS
2000.0000 mg | ORAL_TABLET | Freq: Once | ORAL | Status: AC
  Administered 2014-08-24: 2000 mg via ORAL
  Filled 2014-08-24: qty 4

## 2014-08-24 MED ORDER — OXYCODONE-ACETAMINOPHEN 5-325 MG PO TABS
2.0000 | ORAL_TABLET | Freq: Once | ORAL | Status: AC
  Administered 2014-08-24: 2 via ORAL
  Filled 2014-08-24: qty 2

## 2014-08-24 NOTE — ED Provider Notes (Signed)
CSN: [id number]     Arrival date & time 08/24/14  1245 History   First MD Initiated Contact with Patient 08/24/14 1500     Chief Complaint  Patient presents with  . Groin Pain     (Consider location/radiation/quality/duration/timing/severity/associated sxs/prior Treatment) HPI Comments: Acute on chronic L sided abdominal pain that is intermittent, coming and going.  Associated with one episode of vomiting this morning.  Endorses pain with urination, no hematuria.  No vaginal bleeding or discharge. No fever, no back pain.  Seen on 4/28 for similar pain and had negative CT.  No CP or SOB.  Patient reports abdominal pain ongoing for more than 1 year with negative outpatient workup by Dr. Berdine Addison including MRI.    The history is provided by the patient.    Past Medical History  Diagnosis Date  . Asthma   . Chronic headaches    Past Surgical History  Procedure Laterality Date  . Abdominal hysterectomy     History reviewed. No pertinent family history. History  Substance Use Topics  . Smoking status: Current Every Day Smoker -- 1.00 packs/day    Types: Cigarettes  . Smokeless tobacco: Not on file  . Alcohol Use: No   OB History    No data available     Review of Systems  Constitutional: Positive for activity change and appetite change. Negative for fever.  HENT: Negative for congestion and rhinorrhea.   Eyes: Negative for visual disturbance.  Respiratory: Negative for cough, chest tightness and shortness of breath.   Cardiovascular: Negative for chest pain.  Gastrointestinal: Positive for nausea, vomiting and abdominal pain.  Genitourinary: Positive for dysuria. Negative for vaginal bleeding and vaginal discharge.  Musculoskeletal: Positive for back pain. Negative for myalgias and arthralgias.  Skin: Negative for rash.  Neurological: Negative for dizziness, weakness and headaches.  A complete 10 system review of systems was obtained and all systems are negative except as noted  in the HPI and PMH.      Allergies  Penicillins  Home Medications   Prior to Admission medications   Medication Sig Start Date End Date Taking? Authorizing Provider  acetaminophen (TYLENOL) 500 MG tablet Take 1,500 mg by mouth every 6 (six) hours as needed.   Yes Historical Provider, MD  albuterol (PROVENTIL HFA;VENTOLIN HFA) 108 (90 BASE) MCG/ACT inhaler Inhale 2 puffs into the lungs every 6 (six) hours as needed. For shortness of breath    Yes Historical Provider, MD  HYDROcodone-acetaminophen (NORCO/VICODIN) 5-325 MG per tablet Take 1-2 tablets by mouth every 6 (six) hours as needed. Patient taking differently: Take 1-2 tablets by mouth every 6 (six) hours as needed for moderate pain.  08/11/14  Yes Fredia Sorrow, MD  ibuprofen (ADVIL,MOTRIN) 200 MG tablet Take 800 mg by mouth every 6 (six) hours as needed for mild pain or moderate pain.   Yes Historical Provider, MD   BP 148/88 mmHg  Pulse 77  Temp(Src) 97.7 F (36.5 C) (Oral)  Resp 16  Ht 5\' 2"  (1.575 m)  Wt 135 lb (61.236 kg)  BMI 24.69 kg/m2  SpO2 97% Physical Exam  Constitutional: She is oriented to person, place, and time. She appears well-developed and well-nourished. No distress.  HENT:  Head: Normocephalic and atraumatic.  Mouth/Throat: Oropharynx is clear and moist. No oropharyngeal exudate.  Eyes: Conjunctivae and EOM are normal. Pupils are equal, round, and reactive to light.  Neck: Normal range of motion. Neck supple.  No meningismus.  Cardiovascular: Normal rate, regular rhythm, normal heart  sounds and intact distal pulses.   No murmur heard. Pulmonary/Chest: Effort normal and breath sounds normal. No respiratory distress.  Abdominal: Soft. There is tenderness. There is no rebound and no guarding.  Tenderness to left lower quadrant, no guarding or rebound.  Genitourinary:  Chaperone present.  Normal external genitalia.  TTP LLQ. Cervix absent.  Suprapubic and LLQ tenderness  Musculoskeletal: Normal range of  motion. She exhibits no edema or tenderness.  No CVA tenderness  Neurological: She is alert and oriented to person, place, and time. No cranial nerve deficit. She exhibits normal muscle tone. Coordination normal.  No ataxia on finger to nose bilaterally. No pronator drift. 5/5 strength throughout. CN 2-12 intact. Negative Romberg. Equal grip strength. Sensation intact. Gait is normal.   Skin: Skin is warm.  Psychiatric: She has a normal mood and affect. Her behavior is normal.  Nursing note and vitals reviewed.   ED Course  Procedures (including critical care time) Labs Review Labs Reviewed  WET PREP, GENITAL - Abnormal; Notable for the following:    Trich, Wet Prep FEW (*)    WBC, Wet Prep HPF POC FEW (*)    All other components within normal limits  COMPREHENSIVE METABOLIC PANEL - Abnormal; Notable for the following:    Total Protein 8.2 (*)    ALT 9 (*)    All other components within normal limits  URINALYSIS, ROUTINE W REFLEX MICROSCOPIC - Abnormal; Notable for the following:    Specific Gravity, Urine >1.030 (*)    Ketones, ur TRACE (*)    All other components within normal limits  CBC WITH DIFFERENTIAL/PLATELET  PREGNANCY, URINE  WET PREP  (BD AFFIRM) ([place])  GC/CHLAMYDIA PROBE AMP ([place])    Imaging Review US Transvaginal Non-ob  08/24/2014   CLINICAL DATA:  Pelvic pain for 1 year. Hysterectomy. Right oophorectomy.  EXAM: TRANSABDOMINAL AND TRANSVAGINAL ULTRASOUND OF PELVIS  TECHNIQUE: Both transabdominal and transvaginal ultrasound examinations of the pelvis were performed. Transabdominal technique was performed for global imaging of the pelvis including uterus, ovaries, adnexal regions, and pelvic cul-de-sac.  It was necessary to proceed with endovaginal exam following the transabdominal exam to visualize the left ovary.  COMPARISON:  None.  FINDINGS: Uterus  Surgically absent.  Endometrium  Surgically absent.  Right ovary  Surgically absent.  No adnexal mass.   Left ovary  Measurements: 2.1 by 1.3 by 1.8 cm. Normal appearance/no adnexal mass.  Other findings  No free fluid.  Pelvic left kidney noted.  IMPRESSION: 1. The left ovaries normal appearance. 2. The uterus and the right ovary are unremarkable in sonographic appearance. No specific abnormality along the vaginal cuff is surgically apparent. 3. Pelvic kidney noted.  No hydronephrosis seen.   Electronically Signed   By: Van Clines M.D.   On: 08/24/2014 17:38   US Pelvis Complete  08/24/2014   CLINICAL DATA:  Pelvic pain for 1 year. Hysterectomy. Right oophorectomy.  EXAM: TRANSABDOMINAL AND TRANSVAGINAL ULTRASOUND OF PELVIS  TECHNIQUE: Both transabdominal and transvaginal ultrasound examinations of the pelvis were performed. Transabdominal technique was performed for global imaging of the pelvis including uterus, ovaries, adnexal regions, and pelvic cul-de-sac.  It was necessary to proceed with endovaginal exam following the transabdominal exam to visualize the left ovary.  COMPARISON:  None.  FINDINGS: Uterus  Surgically absent.  Endometrium  Surgically absent.  Right ovary  Surgically absent.  No adnexal mass.  Left ovary  Measurements: 2.1 by 1.3 by 1.8 cm. Normal appearance/no adnexal mass.  Other  findings  No free fluid.  Pelvic left kidney noted.  IMPRESSION: 1. The left ovaries normal appearance. 2. The uterus and the right ovary are unremarkable in sonographic appearance. No specific abnormality along the vaginal cuff is surgically apparent. 3. Pelvic kidney noted.  No hydronephrosis seen.   Electronically Signed   By: Van Clines M.D.   On: 08/24/2014 17:38     EKG Interpretation None      MDM   Final diagnoses:  Pelvic pain in female  Trichomonas vaginalis infection  Abdominal pain, unspecified abdominal location   Lower abdominal pain for the past week. Seen on April 28 and had negative CT scan that showed pelvic ectopic kidney. No obstruction at that time.   urinalysis  negative.  Wet prep is positive for Trichomonas which is treated with Flagyl.  CT scan reviewed from April 28 was negative.   labs appear to be at baseline. Kidney function is normal. Urinalysis negative. Suspect acute on chronic pelvic pain. She appears stable for outpatient follow-up.   Ultrasound today shows normal-appearing ovary and uterus.  Ezequiel Essex, MD 08/25/14 [phone]

## 2014-08-24 NOTE — Discharge Instructions (Signed)
Abdominal Pain Many things can cause abdominal pain. Usually, abdominal pain is not caused by a disease and will improve without treatment. It can often be observed and treated at home. Your health care provider will do a physical exam and possibly order blood tests and X-rays to help determine the seriousness of your pain. However, in many cases, more time must pass before a clear cause of the pain can be found. Before that point, your health care provider may not know if you need more testing or further treatment. HOME CARE INSTRUCTIONS  Monitor your abdominal pain for any changes. The following actions may help to alleviate any discomfort you are experiencing:  Only take over-the-counter or prescription medicines as directed by your health care provider.  Do not take laxatives unless directed to do so by your health care provider.  Try a clear liquid diet (broth, tea, or water) as directed by your health care provider. Slowly move to a bland diet as tolerated. SEEK MEDICAL CARE IF:  You have unexplained abdominal pain.  You have abdominal pain associated with nausea or diarrhea.  You have pain when you urinate or have a bowel movement.  You experience abdominal pain that wakes you in the night.  You have abdominal pain that is worsened or improved by eating food.  You have abdominal pain that is worsened with eating fatty foods.  You have a fever. SEEK IMMEDIATE MEDICAL CARE IF:   Your pain does not go away within 2 hours.  You keep throwing up (vomiting).  Your pain is felt only in portions of the abdomen, such as the right side or the left lower portion of the abdomen.  You pass bloody or black tarry stools. MAKE SURE YOU:  Understand these instructions.   Will watch your condition.   Will get help right away if you are not doing well or get worse.  Document Released: 01/09/2005 Document Revised: 04/06/2013 Document Reviewed: 12/09/2012 [facility] Patient Information  2015 [place], [place]. This information is not intended to replace advice given to you by your health care provider. Make sure you discuss any questions you have with your health care provider.  Trichomoniasis Trichomoniasis is an infection caused by an organism called Trichomonas. The infection can affect both women and men. In women, the outer female genitalia and the vagina are affected. In men, the penis is mainly affected, but the prostate and other reproductive organs can also be involved. Trichomoniasis is a sexually transmitted infection (STI) and is most often passed to another person through sexual contact.  RISK FACTORS  Having unprotected sexual intercourse.  Having sexual intercourse with an infected partner. SIGNS AND SYMPTOMS  Symptoms of trichomoniasis in women include:  Abnormal gray-green frothy vaginal discharge.  Itching and irritation of the vagina.  Itching and irritation of the area outside the vagina. Symptoms of trichomoniasis in men include:   Penile discharge with or without pain.  Pain during urination. This results from inflammation of the urethra. DIAGNOSIS  Trichomoniasis may be found during a Pap test or physical exam. Your health care provider may use one of the following methods to help diagnose this infection:  Examining vaginal discharge under a microscope. For men, urethral discharge would be examined.  Testing the pH of the vagina with a test tape.  Using a vaginal swab test that checks for the Trichomonas organism. A test is available that provides results within a few minutes.  Doing a culture test for the organism. This is not usually  needed. TREATMENT   You may be given medicine to fight the infection. Women should inform their health care provider if they could be or are pregnant. Some medicines used to treat the infection should not be taken during pregnancy.  Your health care provider may recommend over-the-counter medicines or creams to  decrease itching or irritation.  Your sexual partner will need to be treated if infected. HOME CARE INSTRUCTIONS   Take medicines only as directed by your health care provider.  Take over-the-counter medicine for itching or irritation as directed by your health care provider.  Do not have sexual intercourse while you have the infection.  Women should not douche or wear tampons while they have the infection.  Discuss your infection with your partner. Your partner may have gotten the infection from you, or you may have gotten it from your partner.  Have your sex partner get examined and treated if necessary.  Practice safe, informed, and protected sex.  See your health care provider for other STI testing. SEEK MEDICAL CARE IF:   You still have symptoms after you finish your medicine.  You develop abdominal pain.  You have pain when you urinate.  You have bleeding after sexual intercourse.  You develop a rash.  Your medicine makes you sick or makes you throw up (vomit). MAKE SURE YOU:  Understand these instructions.  Will watch your condition.  Will get help right away if you are not doing well or get worse. Document Released: 09/25/2000 Document Revised: 08/16/2013 Document Reviewed: 01/11/2013 [facility] Patient Information 2015 [place], [place]. This information is not intended to replace advice given to you by your health care provider. Make sure you discuss any questions you have with your health care provider.

## 2014-08-24 NOTE — ED Notes (Signed)
Pt states that she has been having left side pain for a week or so now.  States she was seen for same thing already but they didn't find anything.  Pain is moving down into groin and is making her nauseated.

## 2014-08-25 LAB — GC/CHLAMYDIA PROBE AMP (~~LOC~~) NOT AT ARMC
CHLAMYDIA, DNA PROBE: NEGATIVE
Neisseria Gonorrhea: NEGATIVE

## 2015-01-17 ENCOUNTER — Encounter (HOSPITAL_COMMUNITY): Payer: Self-pay | Admitting: Emergency Medicine

## 2015-01-17 ENCOUNTER — Emergency Department (HOSPITAL_COMMUNITY)
Admission: EM | Admit: 2015-01-17 | Discharge: 2015-01-17 | Disposition: A | Discharge status: Home / Self Care | Payer: No Typology Code available for payment source | Attending: Emergency Medicine | Admitting: Emergency Medicine

## 2015-01-17 ENCOUNTER — Emergency Department (HOSPITAL_COMMUNITY): Payer: No Typology Code available for payment source

## 2015-01-17 DIAGNOSIS — Z880 Allergy status to penicillin: Secondary | ICD-10-CM | POA: Insufficient documentation

## 2015-01-17 DIAGNOSIS — R69 Illness, unspecified: Secondary | ICD-10-CM | POA: Insufficient documentation

## 2015-01-17 DIAGNOSIS — J45909 Unspecified asthma, uncomplicated: Secondary | ICD-10-CM | POA: Insufficient documentation

## 2015-01-17 DIAGNOSIS — Z79899 Other long term (current) drug therapy: Secondary | ICD-10-CM | POA: Insufficient documentation

## 2015-01-17 DIAGNOSIS — Z8543 Personal history of malignant neoplasm of ovary: Secondary | ICD-10-CM | POA: Insufficient documentation

## 2015-01-17 DIAGNOSIS — R110 Nausea: Secondary | ICD-10-CM | POA: Insufficient documentation

## 2015-01-17 DIAGNOSIS — K0889 Other specified disorders of teeth and supporting structures: Secondary | ICD-10-CM

## 2015-01-17 DIAGNOSIS — Z720 Tobacco use: Secondary | ICD-10-CM | POA: Insufficient documentation

## 2015-01-17 DIAGNOSIS — G8929 Other chronic pain: Secondary | ICD-10-CM | POA: Insufficient documentation

## 2015-01-17 HISTORY — DX: Neoplasm of unspecified behavior of other genitourinary organ: D49.59

## 2015-01-17 LAB — URINALYSIS, ROUTINE W REFLEX MICROSCOPIC
BILIRUBIN URINE: NEGATIVE
Glucose, UA: NEGATIVE mg/dL
Hgb urine dipstick: NEGATIVE
KETONES UR: NEGATIVE mg/dL
Leukocytes, UA: NEGATIVE
NITRITE: NEGATIVE
Protein, ur: NEGATIVE mg/dL
SPECIFIC GRAVITY, URINE: 1.015 (ref 1.005–1.030)
Urobilinogen, UA: 0.2 mg/dL (ref 0.0–1.0)
pH: 7 (ref 5.0–8.0)

## 2015-01-17 LAB — BASIC METABOLIC PANEL
ANION GAP: 7 (ref 5–15)
BUN: 13 mg/dL (ref 6–20)
CALCIUM: 8.4 mg/dL — AB (ref 8.9–10.3)
CO2: 26 mmol/L (ref 22–32)
CREATININE: 0.95 mg/dL (ref 0.44–1.00)
Chloride: 106 mmol/L (ref 101–111)
GFR calc non Af Amer: >60 mL/min (ref >60)
Glucose, Bld: 91 mg/dL (ref 65–99)
Potassium: 3.7 mmol/L (ref 3.5–5.1)
Sodium: 139 mmol/L (ref 135–145)

## 2015-01-17 MED ORDER — CLINDAMYCIN HCL 300 MG PO CAPS: 300.0000 mg | ORAL_CAPSULE | Freq: Four times a day (QID) | ORAL | Status: DC

## 2015-01-17 MED ORDER — HYDROCODONE-ACETAMINOPHEN 5-325 MG PO TABS
1.0000 | ORAL_TABLET | Freq: Once | ORAL | Status: AC
  Administered 2015-01-17: 1 via ORAL
  Filled 2015-01-17: qty 1

## 2015-01-17 MED ORDER — MECLIZINE HCL 12.5 MG PO TABS
25.0000 mg | ORAL_TABLET | Freq: Once | ORAL | Status: AC
  Administered 2015-01-17: 25 mg via ORAL
  Filled 2015-01-17: qty 2

## 2015-01-17 MED ORDER — HYDROCODONE-ACETAMINOPHEN 5-325 MG PO TABS: 1.0000 | ORAL_TABLET | Freq: Four times a day (QID) | ORAL | Status: DC | PRN

## 2015-01-17 MED ORDER — SODIUM CHLORIDE 0.9 % IV BOLUS (SEPSIS)
1000.0000 mL | Freq: Once | INTRAVENOUS | Status: AC
  Administered 2015-01-17: 1000 mL via INTRAVENOUS

## 2015-01-17 NOTE — ED Notes (Signed)
Made Dr. Dayna Barker aware of pt c/o toothache.  No new orders at this time

## 2015-01-17 NOTE — ED Notes (Signed)
Patient states that after nurse gave her a pill that her teeth and head is killing her now.

## 2015-01-17 NOTE — ED Notes (Signed)
Made Dr. Dayna Barker aware of pain.  No new orders at this time

## 2015-01-17 NOTE — ED Notes (Addendum)
Pt c/o of hyperglycemia x 3 days. Pt states her blood sugars have been elevated, last CBG in 340s. Pt also states, "I feel drunk." When questioned further pt states she feels sluggish and dizzy. Pt denies ETOH use. CBG 119 in triage.

## 2015-01-17 NOTE — ED Provider Notes (Signed)
CSN: [id number]     Arrival date & time 01/17/15  1715 History   First MD Initiated Contact with Patient 01/17/15 1814     Chief Complaint  Patient presents with  . Hyperglycemia     (Consider location/radiation/quality/duration/timing/severity/associated sxs/prior Treatment) Patient is a 54 y.o. female presenting with general illness.  Illness Location:  All over Quality:  Swimmy headed, sleepy Severity:  Mild Onset quality:  Gradual Duration:  2 days Timing:  Constant Chronicity:  New Context:  With sinus infection, elevated blood sugars Relieved by:  None tried Worsened by:  Movement Ineffective treatments:  None tried Associated symptoms: cough, fatigue and nausea   Associated symptoms: no abdominal pain, no chest pain, no fever, no shortness of breath, no sore throat, no vomiting and no wheezing     Past Medical History  Diagnosis Date  . Asthma   . Chronic headaches   . Ovarian tumor    Past Surgical History  Procedure Laterality Date  . Abdominal hysterectomy     No family history on file. Social History  Substance Use Topics  . Smoking status: Current Every Day Smoker -- 1.00 packs/day    Types: Cigarettes  . Smokeless tobacco: None  . Alcohol Use: No   OB History    No data available     Review of Systems  Constitutional: Positive for chills, activity change and fatigue. Negative for fever.  HENT: Positive for postnasal drip, sinus pressure and sneezing. Negative for sore throat.   Eyes: Negative for pain.  Respiratory: Positive for cough. Negative for shortness of breath and wheezing.   Cardiovascular: Negative for chest pain.  Gastrointestinal: Positive for nausea. Negative for vomiting and abdominal pain.  All other systems reviewed and are negative.     Allergies  Penicillins  Home Medications   Prior to Admission medications   Medication Sig Start Date End Date Taking? Authorizing Provider  acetaminophen (TYLENOL) 500 MG tablet Take  1,500 mg by mouth every 6 (six) hours as needed for mild pain or moderate pain.    Yes Historical Provider, MD  albuterol (PROVENTIL HFA;VENTOLIN HFA) 108 (90 BASE) MCG/ACT inhaler Inhale 2 puffs into the lungs every 6 (six) hours as needed. For shortness of breath    Yes Historical Provider, MD  ibuprofen (ADVIL,MOTRIN) 200 MG tablet Take 800 mg by mouth every 6 (six) hours as needed for mild pain or moderate pain.   Yes Historical Provider, MD  clindamycin (CLEOCIN) 300 MG capsule Take 1 capsule (300 mg total) by mouth 4 (four) times daily. X 7 days 01/17/15   Merrily Pew, MD  HYDROcodone-acetaminophen (NORCO/VICODIN) 5-325 MG tablet Take 1-2 tablets by mouth every 6 (six) hours as needed. 01/17/15   Merrily Pew, MD   BP 153/85 mmHg  Pulse 64  Temp(Src) 98.2 F (36.8 C) (Oral)  Resp 18  Ht 5\' 2"  (1.575 m)  Wt 138 lb (62.596 kg)  BMI 25.23 kg/m2  SpO2 99% Physical Exam  Constitutional: She is oriented to person, place, and time. She appears well-developed and well-nourished.  HENT:  Head: Normocephalic and atraumatic.  Eyes: Conjunctivae and EOM are normal. Right eye exhibits no discharge. Left eye exhibits no discharge.  Cardiovascular: Normal rate and regular rhythm.   Pulmonary/Chest: Effort normal and breath sounds normal. No respiratory distress.  Abdominal: Soft. She exhibits no distension. There is no tenderness. There is no rebound.  Musculoskeletal: Normal range of motion. She exhibits no edema or tenderness.  Neurological: She is alert and  oriented to person, place, and time.  Skin: Skin is warm and dry.  Nursing note and vitals reviewed.   ED Course  Procedures (including critical care time) Labs Review Labs Reviewed  BASIC METABOLIC PANEL - Abnormal; Notable for the following:    Calcium 8.4 (*)    All other components within normal limits  URINALYSIS, ROUTINE W REFLEX MICROSCOPIC (NOT AT [facility])    Imaging Review Dg Chest 2 View  01/17/2015   CLINICAL DATA:   Productive cough  EXAM: CHEST  2 VIEW  COMPARISON:  11/16/2012  FINDINGS: Normal heart size. Lungs are hyper aerated with bronchitic changes. Subsegmental atelectasis at the right base. No consolidation. Osteopenia. Stable thoracic spine. No pneumothorax. No pleural effusion.  IMPRESSION: No active cardiopulmonary disease.   Electronically Signed   By: Marybelle Killings M.D.   On: 01/17/2015 20:27   I have personally reviewed and evaluated these images and lab results as part of my medical decision-making.   EKG Interpretation None      MDM   Final diagnoses:  Pain, dental  Illness    Sounds like a viral type illness. However patient has diabetes so we will evaluate for bacterial causes as well as well. We'll give her some meclizine and some fluids to see if this helps with her lightheadedness. We'll check a urinalysis over infection. Patient states she did have an high blood sugars at home so we'll evaluate for any evidence of hyperglycemia or acidosis. If The patient's feel better she can likely be discharged follow with her primary doctor. Symptoms improved. Still with dental pain, will start clindamycin until she can see a dentist.   I have personally and contemperaneously reviewed labs and imaging and used in my decision making as above.   A medical screening exam was performed and I feel the patient has had an appropriate workup for their chief complaint at this time and likelihood of emergent condition existing is low. They have been counseled on decision, discharge, follow up and which symptoms necessitate immediate return to the emergency department. They or their family verbally stated understanding and agreement with plan and discharged in stable condition.      Merrily Pew, MD 01/17/15 [phone]

## 2015-01-17 NOTE — ED Notes (Signed)
Pt states she has recently been treated for a URI.

## 2015-01-17 NOTE — ED Notes (Signed)
Pt states understanding of care given and follow up instructions.  Ambulated from ED  

## 2015-01-18 LAB — CBG MONITORING, ED: GLUCOSE-CAPILLARY: 119 mg/dL — AB (ref 65–99)

## 2015-01-27 ENCOUNTER — Emergency Department (HOSPITAL_COMMUNITY): Payer: No Typology Code available for payment source

## 2015-01-27 ENCOUNTER — Encounter (HOSPITAL_COMMUNITY): Payer: Self-pay | Admitting: Family Medicine

## 2015-01-27 ENCOUNTER — Emergency Department (HOSPITAL_COMMUNITY)
Admission: EM | Admit: 2015-01-27 | Discharge: 2015-01-27 | Disposition: A | Discharge status: Home / Self Care | Payer: No Typology Code available for payment source | Attending: Emergency Medicine | Admitting: Emergency Medicine

## 2015-01-27 DIAGNOSIS — K0889 Other specified disorders of teeth and supporting structures: Secondary | ICD-10-CM | POA: Diagnosis not present

## 2015-01-27 DIAGNOSIS — J45909 Unspecified asthma, uncomplicated: Secondary | ICD-10-CM | POA: Diagnosis not present

## 2015-01-27 DIAGNOSIS — Z8679 Personal history of other diseases of the circulatory system: Secondary | ICD-10-CM | POA: Diagnosis not present

## 2015-01-27 DIAGNOSIS — R519 Headache, unspecified: Secondary | ICD-10-CM

## 2015-01-27 DIAGNOSIS — K002 Abnormalities of size and form of teeth: Secondary | ICD-10-CM | POA: Diagnosis not present

## 2015-01-27 DIAGNOSIS — Z720 Tobacco use: Secondary | ICD-10-CM | POA: Diagnosis not present

## 2015-01-27 DIAGNOSIS — Z86018 Personal history of other benign neoplasm: Secondary | ICD-10-CM | POA: Diagnosis not present

## 2015-01-27 DIAGNOSIS — Z880 Allergy status to penicillin: Secondary | ICD-10-CM | POA: Insufficient documentation

## 2015-01-27 DIAGNOSIS — G8929 Other chronic pain: Secondary | ICD-10-CM | POA: Diagnosis not present

## 2015-01-27 DIAGNOSIS — R51 Headache: Secondary | ICD-10-CM | POA: Insufficient documentation

## 2015-01-27 DIAGNOSIS — R079 Chest pain, unspecified: Secondary | ICD-10-CM | POA: Insufficient documentation

## 2015-01-27 LAB — CBC
HEMATOCRIT: 38.7 % (ref 36.0–46.0)
HEMOGLOBIN: 12.8 g/dL (ref 12.0–15.0)
MCH: 29.8 pg (ref 26.0–34.0)
MCHC: 33.1 g/dL (ref 30.0–36.0)
MCV: 90.2 fL (ref 78.0–100.0)
PLATELETS: 173 10*3/uL (ref 150–400)
RBC: 4.29 MIL/uL (ref 3.87–5.11)
RDW: 13.8 % (ref 11.5–15.5)
WBC: 6.5 10*3/uL (ref 4.0–10.5)

## 2015-01-27 LAB — BASIC METABOLIC PANEL
ANION GAP: 6 (ref 5–15)
BUN: 9 mg/dL (ref 6–20)
CALCIUM: 9 mg/dL (ref 8.9–10.3)
CO2: 27 mmol/L (ref 22–32)
Chloride: 107 mmol/L (ref 101–111)
Creatinine, Ser: 0.89 mg/dL (ref 0.44–1.00)
Glucose, Bld: 94 mg/dL (ref 65–99)
POTASSIUM: 4.1 mmol/L (ref 3.5–5.1)
SODIUM: 140 mmol/L (ref 135–145)

## 2015-01-27 LAB — I-STAT TROPONIN, ED: TROPONIN I, POC: 0 ng/mL (ref 0.00–0.08)

## 2015-01-27 MED ORDER — ACETAMINOPHEN 325 MG PO TABS
650.0000 mg | ORAL_TABLET | Freq: Once | ORAL | Status: AC
  Administered 2015-01-27: 650 mg via ORAL
  Filled 2015-01-27: qty 2

## 2015-01-27 NOTE — ED Notes (Signed)
Pt here for cough, fever, generalized body aches, fever. sts her chest hurts with breathing and movement.

## 2015-01-27 NOTE — Discharge Instructions (Signed)
Nonspecific Chest Pain  °Chest pain can be caused by many different conditions. There is always a chance that your pain could be related to something serious, such as a heart attack or a blood clot in your lungs. Chest pain can also be caused by conditions that are not life-threatening. If you have chest pain, it is very important to follow up with your health care provider. °CAUSES  °Chest pain can be caused by: °· Heartburn. °· Pneumonia or bronchitis. °· Anxiety or stress. °· Inflammation around your heart (pericarditis) or lung (pleuritis or pleurisy). °· A blood clot in your lung. °· A collapsed lung (pneumothorax). It can develop suddenly on its own (spontaneous pneumothorax) or from trauma to the chest. °· Shingles infection (varicella-zoster virus). °· Heart attack. °· Damage to the bones, muscles, and cartilage that make up your chest wall. This can include: °¨ Bruised bones due to injury. °¨ Strained muscles or cartilage due to frequent or repeated coughing or overwork. °¨ Fracture to one or more ribs. °¨ Sore cartilage due to inflammation (costochondritis). °RISK FACTORS  °Risk factors for chest pain may include: °· Activities that increase your risk for trauma or injury to your chest. °· Respiratory infections or conditions that cause frequent coughing. °· Medical conditions or overeating that can cause heartburn. °· Heart disease or family history of heart disease. °· Conditions or health behaviors that increase your risk of developing a blood clot. °· Having had chicken pox (varicella zoster). °SIGNS AND SYMPTOMS °Chest pain can feel like: °· Burning or tingling on the surface of your chest or deep in your chest. °· Crushing, pressure, aching, or squeezing pain. °· Dull or sharp pain that is worse when you move, cough, or take a deep breath. °· Pain that is also felt in your back, neck, shoulder, or arm, or pain that spreads to any of these areas. °Your chest pain may come and go, or it may stay  constant. °DIAGNOSIS °Lab tests or other studies may be needed to find the cause of your pain. Your health care provider may have you take a test called an ambulatory ECG (electrocardiogram). An ECG records your heartbeat patterns at the time the test is performed. You may also have other tests, such as: °· Transthoracic echocardiogram (TTE). During echocardiography, sound waves are used to create a picture of all of the heart structures and to look at how blood flows through your heart. °· Transesophageal echocardiogram (TEE). This is a more advanced imaging test that obtains images from inside your body. It allows your health care provider to see your heart in finer detail. °· Cardiac monitoring. This allows your health care provider to monitor your heart rate and rhythm in real time. °· Holter monitor. This is a portable device that records your heartbeat and can help to diagnose abnormal heartbeats. It allows your health care provider to track your heart activity for several days, if needed. °· Stress tests. These can be done through exercise or by taking medicine that makes your heart beat more quickly. °· Blood tests. °· Imaging tests. °TREATMENT  °Your treatment depends on what is causing your chest pain. Treatment may include: °· Medicines. These may include: °¨ Acid blockers for heartburn. °¨ Anti-inflammatory medicine. °¨ Pain medicine for inflammatory conditions. °¨ Antibiotic medicine, if an infection is present. °¨ Medicines to dissolve blood clots. °¨ Medicines to treat coronary artery disease. °· Supportive care for conditions that do not require medicines. This may include: °¨ Resting. °¨ Applying heat   or cold packs to injured areas. °¨ Limiting activities until pain decreases. °HOME CARE INSTRUCTIONS °· If you were prescribed an antibiotic medicine, finish it all even if you start to feel better. °· Avoid any activities that bring on chest pain. °· Do not use any tobacco products, including  cigarettes, chewing tobacco, or electronic cigarettes. If you need help quitting, ask your health care provider. °· Do not drink alcohol. °· Take medicines only as directed by your health care provider. °· Keep all follow-up visits as directed by your health care provider. This is important. This includes any further testing if your chest pain does not go away. °· If heartburn is the cause for your chest pain, you may be told to keep your head raised (elevated) while sleeping. This reduces the chance that acid will go from your stomach into your esophagus. °· Make lifestyle changes as directed by your health care provider. These may include: °¨ Getting regular exercise. Ask your health care provider to suggest some activities that are safe for you. °¨ Eating a heart-healthy diet. A registered dietitian can help you to learn healthy eating options. °¨ Maintaining a healthy weight. °¨ Managing diabetes, if necessary. °¨ Reducing stress. °SEEK MEDICAL CARE IF: °· Your chest pain does not go away after treatment. °· You have a rash with blisters on your chest. °· You have a fever. °SEEK IMMEDIATE MEDICAL CARE IF:  °· Your chest pain is worse. °· You have an increasing cough, or you cough up blood. °· You have severe abdominal pain. °· You have severe weakness. °· You faint. °· You have chills. °· You have sudden, unexplained chest discomfort. °· You have sudden, unexplained discomfort in your arms, back, neck, or jaw. °· You have shortness of breath at any time. °· You suddenly start to sweat, or your skin gets clammy. °· You feel nauseous or you vomit. °· You suddenly feel light-headed or dizzy. °· Your heart begins to beat quickly, or it feels like it is skipping beats. °These symptoms may represent a serious problem that is an emergency. Do not wait to see if the symptoms will go away. Get medical help right away. Call your local emergency services (911 in the [place] Do not drive yourself to the hospital. °  °This  information is not intended to replace advice given to you by your health care provider. Make sure you discuss any questions you have with your health care provider. °  °Document Released: 01/09/2005 Document Revised: 04/22/2014 Document Reviewed: 11/05/2013 °Elsevier Interactive Patient Education ©2016 [employer] ° °

## 2015-01-27 NOTE — ED Provider Notes (Signed)
CSN: [id number]     Arrival date & time 01/27/15  1415 History   First MD Initiated Contact with Patient 01/27/15 1825     Chief Complaint  Patient presents with  . Chest Pain  . Cough  . Generalized Body Aches      HPI Patient presents to emergency department complaining of headache off and on for several months.  She has history of migraine headaches.  She also reports some mild right lower dental pain as well as some right anterior chest pain without radiation.  She denies shortness of breath.  She denies cough.  She denies fevers or chills.  No weakness of her upper lower extremities.  No significant shortness of breath.  She does report some generalized myalgias which have been here for several weeks as well.  She's been unable to get into her primary care physician because it usually "takes months".   Past Medical History  Diagnosis Date  . Asthma   . Chronic headaches   . Ovarian tumor    Past Surgical History  Procedure Laterality Date  . Abdominal hysterectomy     History reviewed. No pertinent family history. Social History  Substance Use Topics  . Smoking status: Current Every Day Smoker -- 1.00 packs/day    Types: Cigarettes  . Smokeless tobacco: None  . Alcohol Use: No   OB History    No data available     Review of Systems  All other systems reviewed and are negative.     Allergies  Penicillins  Home Medications   Prior to Admission medications   Medication Sig Start Date End Date Taking? Authorizing Provider  albuterol (PROVENTIL HFA;VENTOLIN HFA) 108 (90 BASE) MCG/ACT inhaler Inhale 2 puffs into the lungs every 6 (six) hours as needed for wheezing or shortness of breath.    Yes Historical Provider, MD  ibuprofen (ADVIL,MOTRIN) 800 MG tablet Take 800 mg by mouth every 6 (six) hours as needed (pain).   Yes Historical Provider, MD   BP 109/72 mmHg  Pulse 78  Temp(Src) 99.2 F (37.3 C) (Oral)  Resp 16  SpO2 99% Physical Exam  Constitutional:  She is oriented to person, place, and time. She appears well-developed and well-nourished. No distress.  HENT:  Head: Normocephalic and atraumatic.  Dental decay of her first and second molar on the right lower without gingival swelling or fluctuance.  Tolerating secretions.  Airway patent.  Submandibular space is soft.  No significant appreciable cervical lymphadenopathy.  Eyes: EOM are normal.  Neck: Normal range of motion.  Cardiovascular: Normal rate, regular rhythm and normal heart sounds.   Pulmonary/Chest: Effort normal and breath sounds normal.  Abdominal: Soft. She exhibits no distension. There is no tenderness.  Musculoskeletal: Normal range of motion.  Normal grip strength bilaterally.  Neurological: She is alert and oriented to person, place, and time.  Skin: Skin is warm and dry.  Psychiatric: She has a normal mood and affect. Judgment normal.  Nursing note and vitals reviewed.   ED Course  Procedures (including critical care time) Labs Review Labs Reviewed  BASIC METABOLIC PANEL  CBC  I-STAT [place], ED    Imaging Review Dg Chest 2 View  01/27/2015  CLINICAL DATA:  Right side chest pain x 3 days, cough and wheezing started yesterday, right arm numbness today. Hx of asthma. EXAM: CHEST  2 VIEW COMPARISON:  01/17/2015 FINDINGS: A minimal pectus excavatum deformity. Mild hyperinflation. Midline trachea. Normal heart size and mediastinal contours. No pleural effusion or  pneumothorax. Diffuse peribronchial thickening. Clear lungs. IMPRESSION: 1.  No acute cardiopulmonary disease. 2. Mild peribronchial thickening which may relate to chronic bronchitis or smoking. Electronically Signed   By: Abigail Miyamoto M.D.   On: 01/27/2015 15:10   I have personally reviewed and evaluated these images and lab results as part of my medical decision-making.   EKG Interpretation   Date/Time:  Friday January 27 2015 14:22:46 EDT Ventricular Rate:  83 PR Interval:  164 QRS Duration:  88 QT Interval:  368 QTC Calculation: 432 R Axis:   88 Text Interpretation:  Normal sinus rhythm Right atrial enlargement  Possible Anterior infarct , age undetermined Abnormal ECG No significant  change was found Confirmed by Laquincy Eastridge  MD, Lennette Bihari ([id number]) on 01/27/2015  7:18:47 PM      MDM   Final diagnoses:  Chest pain, unspecified chest pain type  Headache, unspecified headache type    Nonspecific headache and dental pain.  Patient will follow-up with the dentist as well as her primary care physician.  In regards to her chest pain seems to be musculoskeletal.  EKG and troponin are without abnormality.  Primary care follow-up.  Patient understands return the ER for new or worsening symptoms.    Jola Schmidt, MD 01/27/15 [phone]

## 2016-06-18 ENCOUNTER — Emergency Department (HOSPITAL_COMMUNITY): Payer: Self-pay

## 2016-06-18 ENCOUNTER — Encounter (HOSPITAL_COMMUNITY): Payer: Self-pay | Admitting: *Deleted

## 2016-06-18 ENCOUNTER — Emergency Department (HOSPITAL_COMMUNITY)
Admission: EM | Admit: 2016-06-18 | Discharge: 2016-06-19 | Disposition: A | Discharge status: Home / Self Care | Payer: Self-pay | Attending: Emergency Medicine | Admitting: Emergency Medicine

## 2016-06-18 DIAGNOSIS — Z8543 Personal history of malignant neoplasm of ovary: Secondary | ICD-10-CM | POA: Insufficient documentation

## 2016-06-18 DIAGNOSIS — F1290 Cannabis use, unspecified, uncomplicated: Secondary | ICD-10-CM | POA: Insufficient documentation

## 2016-06-18 DIAGNOSIS — J45909 Unspecified asthma, uncomplicated: Secondary | ICD-10-CM | POA: Insufficient documentation

## 2016-06-18 DIAGNOSIS — F449 Dissociative and conversion disorder, unspecified: Secondary | ICD-10-CM | POA: Insufficient documentation

## 2016-06-18 DIAGNOSIS — Z79899 Other long term (current) drug therapy: Secondary | ICD-10-CM | POA: Insufficient documentation

## 2016-06-18 DIAGNOSIS — F17210 Nicotine dependence, cigarettes, uncomplicated: Secondary | ICD-10-CM | POA: Insufficient documentation

## 2016-06-18 DIAGNOSIS — J018 Other acute sinusitis: Secondary | ICD-10-CM | POA: Insufficient documentation

## 2016-06-18 LAB — COMPREHENSIVE METABOLIC PANEL
ALBUMIN: 4.6 g/dL (ref 3.5–5.0)
ALT: 18 U/L (ref 14–54)
AST: 23 U/L (ref 15–41)
Alkaline Phosphatase: 70 U/L (ref 38–126)
Anion gap: 10 (ref 5–15)
BILIRUBIN TOTAL: 0.3 mg/dL (ref 0.3–1.2)
BUN: 9 mg/dL (ref 6–20)
CHLORIDE: 106 mmol/L (ref 101–111)
CO2: 21 mmol/L — ABNORMAL LOW (ref 22–32)
Calcium: 9.2 mg/dL (ref 8.9–10.3)
Creatinine, Ser: 0.73 mg/dL (ref 0.44–1.00)
GFR calc Af Amer: >60 mL/min (ref >60)
GFR calc non Af Amer: >60 mL/min (ref >60)
GLUCOSE: 121 mg/dL — AB (ref 65–99)
Potassium: 3.2 mmol/L — ABNORMAL LOW (ref 3.5–5.1)
Sodium: 137 mmol/L (ref 135–145)
TOTAL PROTEIN: 8.4 g/dL — AB (ref 6.5–8.1)

## 2016-06-18 LAB — CBG MONITORING, ED: GLUCOSE-CAPILLARY: 125 mg/dL — AB (ref 65–99)

## 2016-06-18 LAB — CBC WITH DIFFERENTIAL/PLATELET
Basophils Absolute: 0 10*3/uL (ref 0.0–0.1)
Basophils Relative: 0 %
Eosinophils Absolute: 0.3 10*3/uL (ref 0.0–0.7)
Eosinophils Relative: 4 %
HCT: 39.8 % (ref 36.0–46.0)
Hemoglobin: 13.7 g/dL (ref 12.0–15.0)
LYMPHS PCT: 42 %
Lymphs Abs: 3.1 10*3/uL (ref 0.7–4.0)
MCH: 30.6 pg (ref 26.0–34.0)
MCHC: 34.4 g/dL (ref 30.0–36.0)
MCV: 88.8 fL (ref 78.0–100.0)
MONOS PCT: 7 %
Monocytes Absolute: 0.5 10*3/uL (ref 0.1–1.0)
NEUTROS ABS: 3.4 10*3/uL (ref 1.7–7.7)
NEUTROS PCT: 47 %
PLATELETS: 217 10*3/uL (ref 150–400)
RBC: 4.48 MIL/uL (ref 3.87–5.11)
RDW: 13.9 % (ref 11.5–15.5)
WBC: 7.3 10*3/uL (ref 4.0–10.5)

## 2016-06-18 LAB — RAPID URINE DRUG SCREEN, HOSP PERFORMED
Amphetamines: NOT DETECTED
BARBITURATES: NOT DETECTED
Benzodiazepines: NOT DETECTED
Cocaine: NOT DETECTED
Opiates: NOT DETECTED
Tetrahydrocannabinol: POSITIVE — AB

## 2016-06-18 LAB — URINALYSIS, ROUTINE W REFLEX MICROSCOPIC
BILIRUBIN URINE: NEGATIVE
GLUCOSE, UA: NEGATIVE mg/dL
HGB URINE DIPSTICK: NEGATIVE
KETONES UR: NEGATIVE mg/dL
Leukocytes, UA: NEGATIVE
Nitrite: NEGATIVE
PROTEIN: NEGATIVE mg/dL
Specific Gravity, Urine: 1.015 (ref 1.005–1.030)
pH: 7 (ref 5.0–8.0)

## 2016-06-18 LAB — AMMONIA: AMMONIA: 24 umol/L (ref 9–35)

## 2016-06-18 LAB — TROPONIN I

## 2016-06-18 MED ORDER — LORAZEPAM 2 MG/ML IJ SOLN
1.0000 mg | Freq: Once | INTRAMUSCULAR | Status: DC
  Filled 2016-06-18: qty 1

## 2016-06-18 NOTE — ED Notes (Signed)
Pt stated that she "wants to see her grandbaby". Pt also states that "im at home in my bedroom". Pt is asking to see multiple family members that are not present.

## 2016-06-18 NOTE — ED Notes (Signed)
EKG was done upon arrival to to room. EKG seen by Dr Thurnell Garbe.

## 2016-06-18 NOTE — ED Triage Notes (Signed)
Pt c/o sob that started today with headache and dizziness; pt was recently dx with the flu x 2 weeks ago

## 2016-06-18 NOTE — ED Provider Notes (Signed)
[facility] DEPT Provider Note   CSN: [id number] Arrival date & time: 06/18/16  2109  By signing my name below, I, Gwenlyn Fudge, attest that this documentation has been prepared under the direction and in the presence of Lily Kocher, PA-C. Electronically Signed: Gwenlyn Fudge, ED Scribe. 06/18/16. 9:42 PM.  History   Chief Complaint Chief Complaint  Patient presents with  . Shortness of Breath   LEVEL 5 CAVEAT: HPI and ROS limited due to Confusion  The history is provided by the patient and the spouse. No language interpreter was used.   HPI Comments: Alexis Lucas is a 56 y.o. female with PMHx of Asthma who presents to the Emergency Department complaining of sudden onset, constant, moderate, unchanged dizziness for 2 days. Pt and husband described dizziness as a "swimming headed" sensation. No recent suspicious PO intake, new medications or change of medications. Pt last seen normal prior to 8:00 PM. Pt did not complain or exhibit similar symptoms yesterday. No recent falls. No PMHx of DM. PCP is Dr. Berdine Addison.   Past Medical History:  Diagnosis Date  . Asthma   . Chronic headaches   . Ovarian tumor     There are no active problems to display for this patient.   Past Surgical History:  Procedure Laterality Date  . ABDOMINAL HYSTERECTOMY      OB History    No data available       Home Medications    Prior to Admission medications   Medication Sig Start Date End Date Taking? Authorizing Provider  albuterol (PROVENTIL HFA;VENTOLIN HFA) 108 (90 BASE) MCG/ACT inhaler Inhale 2 puffs into the lungs every 6 (six) hours as needed for wheezing or shortness of breath.     Historical Provider, MD  ibuprofen (ADVIL,MOTRIN) 800 MG tablet Take 800 mg by mouth every 6 (six) hours as needed (pain).    Historical Provider, MD    Family History History reviewed. No pertinent family history.  Social History Social History  Substance Use Topics  . Smoking status: Current Every Day  Smoker    Packs/day: 1.00    Types: Cigarettes  . Smokeless tobacco: Never Used  . Alcohol use No     Allergies   Penicillins and Shellfish allergy  Review of Systems Review of Systems  Unable to perform ROS: Other (Confusion)   Physical Exam Updated Vital Signs BP 142/95 (BP Location: Left Arm)   Pulse 69   Temp 97.7 F (36.5 C) (Oral)   Resp 18   Ht 5\' 2"  (1.575 m)   Wt 140 lb (63.5 kg)   SpO2 99%   BMI 25.61 kg/m   Physical Exam  Constitutional: She is oriented to person, place, and time. She appears well-developed and well-nourished. She is active. No distress.  HENT:  Head: Normocephalic and atraumatic.  Eyes: Conjunctivae are normal. Pupils are equal, round, and reactive to light.  Cardiovascular: Normal rate.   Pulmonary/Chest: Effort normal. No respiratory distress.  Abdominal: There is tenderness.  Musculoskeletal: Normal range of motion.  Capillary refill < 2 seconds  Neurological: She is alert and oriented to person, place, and time.  No facial weakness appreciated  No difficulty with speaking No upper or lower motor deficits  No sensory deficits Pt confused and tearful   Skin: Skin is warm and dry. Capillary refill takes less than 2 seconds.  Psychiatric: She has a normal mood and affect. Her behavior is normal. She is actively hallucinating.  Auditory and visual hallucinations   Nursing  note and vitals reviewed.  ED Treatments / Results  DIAGNOSTIC STUDIES: Oxygen Saturation is 99% on RA, normal by my interpretation.    COORDINATION OF CARE: 9:24 PM Discussed treatment plan with husband at bedside which includes CT Head, Chemistries, urinalysis, and urine drug screen and husband agreed to plan.  Labs (all labs ordered are listed, but only abnormal results are displayed) Labs Reviewed - No data to display  EKG  EKG Interpretation None       Radiology No results found.  Procedures Procedures (including critical care  time)  Medications Ordered in ED Medications - No data to display   Initial Impression / Assessment and Plan / ED Course  I have reviewed the triage vital signs and the nursing notes.  Pertinent labs & imaging results that were available during my care of the patient were reviewed by me and considered in my medical decision making (see chart for details).     **I personally performed the services described in this documentation, which was scribed in my presence. The recorded information has been reviewed and is accurate.*  She was last seen  at her usual self approximately at 8 PM tonight. She has been complaining of being "swimming headed" for the past 2-3 days. Had not been noted to have hallucinations prior to this time. This history was obtained by husband. Plan is CT Head, Chemistries, urinalysis, and urine drug screen. Recheck. Patient's husband is in the room. Urinalysis is well within normal limits. Urine drug screen reveals THC, otherwise negative. The ammonia level is 24, within normal limits. The competence of metabolic panel shows the potassium to be slightly low at 3.2, the CO2 slightly low at 21, the remainder of the competence of metabolic panels within normal limits. The anion gap is to use. The electrocardiogram is negative for acute event. The troponin is normal at less than 0.03. The complete blood count was well within normal limits. Capillary blood glucose on admission to the emergency department was 125. CT head scan shows a normal brain. The patient has a severe paranasal sinus disease with air-fluid levels in the right frontal and maxillary sinuses. Suspect this is part of the reason the patient was feeling quite "swimming headed".  The patient is now calm. She recognizes where she is she recognizes her husband. Chest x-ray is pending. Chest x-ray is nonacute.  I discussed the findings with the husband in terms which he understands. The patient is now calm and quiet. She  is awake and alert and oriented ambulatory without problem. Patient will be discharged home with follow-up by Dr. Berdine Addison. I've encouraged the husband to have the patient return immediately if any changes, problems, or concerns. He is in agreement with this plan. Final Clinical Impressions(s) / ED Diagnoses   Final diagnoses:  Loss of perception accompanying hysteria  Acute non-recurrent sinusitis of other sinus    New Prescriptions New Prescriptions   No medications on file     Lily Kocher, PA-C 06/20/16 [place], DO 06/21/16 1327

## 2016-06-19 MED ORDER — PROMETHAZINE HCL 12.5 MG PO TABS
12.5000 mg | ORAL_TABLET | Freq: Once | ORAL | Status: AC
  Administered 2016-06-19: 12.5 mg via ORAL
  Filled 2016-06-19: qty 1

## 2016-06-19 MED ORDER — PSEUDOEPHEDRINE HCL 60 MG PO TABS
60.0000 mg | ORAL_TABLET | Freq: Once | ORAL | Status: AC
  Administered 2016-06-19: 60 mg via ORAL
  Filled 2016-06-19: qty 1

## 2016-06-19 MED ORDER — AZITHROMYCIN 250 MG PO TABS
500.0000 mg | ORAL_TABLET | Freq: Once | ORAL | Status: AC
  Administered 2016-06-19: 500 mg via ORAL
  Filled 2016-06-19: qty 2

## 2016-06-19 MED ORDER — AZITHROMYCIN 250 MG PO TABS: ORAL_TABLET | ORAL | 0 refills | Status: DC

## 2016-06-19 MED ORDER — DEXAMETHASONE 4 MG PO TABS: 4.0000 mg | ORAL_TABLET | Freq: Two times a day (BID) | ORAL | 0 refills | Status: DC

## 2016-06-19 MED ORDER — DOXYCYCLINE HYCLATE 100 MG PO TABS
100.0000 mg | ORAL_TABLET | Freq: Once | ORAL | Status: AC
  Administered 2016-06-19: 100 mg via ORAL
  Filled 2016-06-19: qty 1

## 2016-06-19 MED ORDER — LORATADINE-PSEUDOEPHEDRINE ER 5-120 MG PO TB12: 1.0000 | ORAL_TABLET | Freq: Two times a day (BID) | ORAL | 0 refills | Status: DC

## 2016-06-19 NOTE — ED Notes (Signed)
Pt verbalized understanding of discharge instructions. Pt wheeled to waiting room.  

## 2016-06-19 NOTE — Discharge Instructions (Signed)
CT scan of your head is negative for stroke or acute event. It does show acute problem with sinusitis. Please use Zithromax, Decadron, and Claritin-D daily with a meal. Please see Dr. Eber Jones for additional evaluation and management if not improving.

## 2016-06-24 ENCOUNTER — Emergency Department (HOSPITAL_COMMUNITY)
Admission: EM | Admit: 2016-06-24 | Discharge: 2016-06-24 | Disposition: A | Discharge status: Home / Self Care | Payer: Self-pay | Attending: Emergency Medicine | Admitting: Emergency Medicine

## 2016-06-24 DIAGNOSIS — Z79899 Other long term (current) drug therapy: Secondary | ICD-10-CM | POA: Insufficient documentation

## 2016-06-24 DIAGNOSIS — J45909 Unspecified asthma, uncomplicated: Secondary | ICD-10-CM | POA: Insufficient documentation

## 2016-06-24 DIAGNOSIS — J324 Chronic pansinusitis: Secondary | ICD-10-CM

## 2016-06-24 DIAGNOSIS — F17210 Nicotine dependence, cigarettes, uncomplicated: Secondary | ICD-10-CM | POA: Insufficient documentation

## 2016-06-24 MED ORDER — FLUTICASONE PROPIONATE 50 MCG/ACT NA SUSP: 2.0000 | Freq: Every day | NASAL | 0 refills | Status: DC

## 2016-06-24 MED ORDER — LEVOFLOXACIN 500 MG PO TABS: 500.0000 mg | ORAL_TABLET | Freq: Every day | ORAL | 0 refills | Status: DC

## 2016-06-24 NOTE — ED Notes (Signed)
Pt states she was seen here last week with facial pressure and dizziness, was DXed with sinus infection and sent home with antibiotic.  Pt states she started to feel worse again and came back to be seen before she got worse

## 2016-06-24 NOTE — ED Triage Notes (Signed)
Head congestion .  Seen here last week and diagnosed with a sinus infection.   Dizziness since last night

## 2016-06-24 NOTE — ED Notes (Signed)
Pt states understanding of care given and follow up instructions.  Pt a/o, ambulated from ED with steady gait  

## 2016-06-24 NOTE — ED Provider Notes (Signed)
[facility] DEPT Provider Note   CSN: [id number] Arrival date & time: 06/24/16  6073  By signing my name below, I, Dolores Hoose, attest that this documentation has been prepared under the direction and in the presence of Virgel Manifold, MD . Electronically Signed: Dolores Hoose, Scribe. 06/24/2016. 9:40 PM.  History   Chief Complaint Chief Complaint  Patient presents with  . Nasal Congestion   The history is provided by the patient. No language interpreter was used.    HPI Comments:  Alexis Lucas is a 56 y.o. female with pmhx of asthma who presents to the Emergency Department complaining of constant, unchanged head congestion onset >7 days ago. Pt was seen recently in the ED and diagnosed with a sinus infection and given decadron and zithromax. She repiorts associated chest congestion, chills, SOB, and dizziness. No modifying factors indicated. Pt has tried her prescribed medications, tylenol, claritin D, and at-home breathing treatments with minimal relief. She denies any other symptoms at this time.   Past Medical History:  Diagnosis Date  . Asthma   . Chronic headaches   . Ovarian tumor     There are no active problems to display for this patient.   Past Surgical History:  Procedure Laterality Date  . ABDOMINAL HYSTERECTOMY      OB History    No data available       Home Medications    Prior to Admission medications   Medication Sig Start Date End Date Taking? Authorizing Provider  acetaminophen (TYLENOL) 500 MG tablet Take 500 mg by mouth every 6 (six) hours as needed for mild pain or moderate pain.   Yes Historical Provider, MD  albuterol (PROVENTIL HFA;VENTOLIN HFA) 108 (90 BASE) MCG/ACT inhaler Inhale 2 puffs into the lungs every 6 (six) hours as needed for wheezing or shortness of breath.    Yes Historical Provider, MD  albuterol (PROVENTIL) (2.5 MG/3ML) 0.083% nebulizer solution Take 2.5 mg by nebulization every 6 (six) hours as needed for wheezing or  shortness of breath.   Yes Historical Provider, MD  dexamethasone (DECADRON) 4 MG tablet Take 1 tablet (4 mg total) by mouth 2 (two) times daily with a meal. 06/19/16  Yes Lily Kocher, PA-C  loratadine-pseudoephedrine (CLARITIN-D 12 HOUR) 5-120 MG tablet Take 1 tablet by mouth 2 (two) times daily. 06/19/16  Yes Lily Kocher, PA-C  azithromycin (ZITHROMAX) 250 MG tablet 1 po daily with food Patient not taking: Reported on 06/24/2016 06/19/16   Lily Kocher, PA-C    Family History No family history on file.  Social History Social History  Substance Use Topics  . Smoking status: Current Every Day Smoker    Packs/day: 1.00    Types: Cigarettes  . Smokeless tobacco: Never Used  . Alcohol use No     Allergies   Penicillins and Shellfish allergy   Review of Systems Review of Systems  Constitutional: Positive for chills.  HENT: Positive for congestion.   Respiratory: Positive for shortness of breath.   Neurological: Positive for dizziness.  All other systems reviewed and are negative.    Physical Exam Updated Vital Signs BP 140/71   Pulse 88   Temp 99.5 F (37.5 C) (Oral)   Resp 16   Ht 5\' 2"  (1.575 m)   Wt 140 lb (63.5 kg)   BMI 25.61 kg/m   Physical Exam  Constitutional: She is oriented to person, place, and time. She appears well-developed and well-nourished. No distress.  HENT:  Head: Normocephalic and atraumatic.  Maxillary  sinus tenderness  Eyes: EOM are normal.  Neck: Normal range of motion.  Cardiovascular: Normal rate, regular rhythm and normal heart sounds.   Pulmonary/Chest: Effort normal and breath sounds normal.  Abdominal: Soft. She exhibits no distension. There is no tenderness.  Musculoskeletal: Normal range of motion.  Neurological: She is alert and oriented to person, place, and time.  Skin: Skin is warm and dry.  Psychiatric: She has a normal mood and affect. Judgment normal.  Nursing note and vitals reviewed.    ED Treatments / Results    COORDINATION OF CARE:  9:46 PM Discussed treatment plan with pt at bedside which includes abx and pt agreed to plan.   Labs (all labs ordered are listed, but only abnormal results are displayed) Labs Reviewed - No data to display  EKG  EKG Interpretation None       Radiology No results found.  Procedures Procedures (including critical care time)  Medications Ordered in ED Medications - No data to display   Initial Impression / Assessment and Plan / ED Course  I have reviewed the triage vital signs and the nursing notes.  Pertinent labs & imaging results that were available during my care of the patient were reviewed by me and considered in my medical decision making (see chart for details).      Final Clinical Impressions(s) / ED Diagnoses   Final diagnoses:  Pansinusitis, unspecified chronicity    New Prescriptions New Prescriptions   No medications on file    I personally preformed the services scribed in my presence. The recorded information has been reviewed is accurate. Virgel Manifold, MD.     Virgel Manifold, MD 07/02/16 [phone]

## 2016-06-27 ENCOUNTER — Encounter (HOSPITAL_COMMUNITY): Payer: Self-pay

## 2016-06-27 ENCOUNTER — Emergency Department (HOSPITAL_COMMUNITY): Payer: Self-pay

## 2016-06-27 ENCOUNTER — Emergency Department (HOSPITAL_COMMUNITY)
Admission: EM | Admit: 2016-06-27 | Discharge: 2016-06-27 | Disposition: A | Discharge status: Home / Self Care | Payer: Self-pay | Attending: Emergency Medicine | Admitting: Emergency Medicine

## 2016-06-27 DIAGNOSIS — J45909 Unspecified asthma, uncomplicated: Secondary | ICD-10-CM | POA: Insufficient documentation

## 2016-06-27 DIAGNOSIS — F17210 Nicotine dependence, cigarettes, uncomplicated: Secondary | ICD-10-CM | POA: Insufficient documentation

## 2016-06-27 DIAGNOSIS — Z79899 Other long term (current) drug therapy: Secondary | ICD-10-CM | POA: Insufficient documentation

## 2016-06-27 DIAGNOSIS — J069 Acute upper respiratory infection, unspecified: Secondary | ICD-10-CM

## 2016-06-27 LAB — CBC
HCT: 36.2 % (ref 36.0–46.0)
Hemoglobin: 12.4 g/dL (ref 12.0–15.0)
MCH: 30.6 pg (ref 26.0–34.0)
MCHC: 34.3 g/dL (ref 30.0–36.0)
MCV: 89.4 fL (ref 78.0–100.0)
PLATELETS: 183 10*3/uL (ref 150–400)
RBC: 4.05 MIL/uL (ref 3.87–5.11)
RDW: 14.2 % (ref 11.5–15.5)
WBC: 13.2 10*3/uL — ABNORMAL HIGH (ref 4.0–10.5)

## 2016-06-27 LAB — D-DIMER, QUANTITATIVE: D-Dimer, Quant: 0.46 ug/mL-FEU (ref 0.00–0.50)

## 2016-06-27 LAB — BASIC METABOLIC PANEL
Anion gap: 7 (ref 5–15)
BUN: 19 mg/dL (ref 6–20)
CO2: 24 mmol/L (ref 22–32)
CREATININE: 0.96 mg/dL (ref 0.44–1.00)
Calcium: 7.9 mg/dL — ABNORMAL LOW (ref 8.9–10.3)
Chloride: 104 mmol/L (ref 101–111)
GFR calc Af Amer: >60 mL/min (ref >60)
GLUCOSE: 98 mg/dL (ref 65–99)
POTASSIUM: 3.2 mmol/L — AB (ref 3.5–5.1)
SODIUM: 135 mmol/L (ref 135–145)

## 2016-06-27 LAB — TROPONIN I

## 2016-06-27 LAB — BRAIN NATRIURETIC PEPTIDE: B Natriuretic Peptide: 58 pg/mL (ref 0.0–100.0)

## 2016-06-27 NOTE — ED Notes (Signed)
Pt states understanding of d/c instructions, medications, and follow up care. No concerns stated at this time.

## 2016-06-27 NOTE — ED Provider Notes (Signed)
[facility] DEPT Provider Note   CSN: [id number] Arrival date & time: 06/27/16  2010   By signing my name below, I, Hilbert Odor, attest that this documentation has been prepared under the direction and in the presence of Dorie Rank, MD. Electronically Signed: Hilbert Odor, Scribe. 06/27/16. 8:43 PM. History   Chief Complaint Chief Complaint  Patient presents with  . Shortness of Breath   The history is provided by the patient. No language interpreter was used.  HPI Comments: Alexis Lucas is a 56 y.o. female who presents to the Emergency Department complaining of SOB for the past few days. The patient states that she was seen her in the ED last week and was diagnosed with a sinus infection. She was placed on decadron and Zithromax at that time. She was seen a few days ago and was prescribed some steroids to help open up her sinuses. She states that since she began taking those steroids, she has been short winded. She didn't take her steroids today. She thought it would make her feel better, but she actually feels worse. The patient also reports some leg swelling. She hasn't been to see her PCP recently. She doesn't reports taking anything today. She is a current smoker.  Past Medical History:  Diagnosis Date  . Asthma   . Chronic headaches   . Ovarian tumor     There are no active problems to display for this patient.   Past Surgical History:  Procedure Laterality Date  . ABDOMINAL HYSTERECTOMY      OB History    No data available       Home Medications    Prior to Admission medications   Medication Sig Start Date End Date Taking? Authorizing Provider  acetaminophen (TYLENOL) 500 MG tablet Take 500 mg by mouth every 6 (six) hours as needed for mild pain or moderate pain.   Yes Historical Provider, MD  albuterol (PROVENTIL HFA;VENTOLIN HFA) 108 (90 BASE) MCG/ACT inhaler Inhale 2 puffs into the lungs every 6 (six) hours as needed for wheezing or shortness of  breath.    Yes Historical Provider, MD  albuterol (PROVENTIL) (2.5 MG/3ML) 0.083% nebulizer solution Take 2.5 mg by nebulization every 6 (six) hours as needed for wheezing or shortness of breath.   Yes Historical Provider, MD  dexamethasone (DECADRON) 4 MG tablet Take 1 tablet (4 mg total) by mouth 2 (two) times daily with a meal. 06/19/16  Yes Lily Kocher, PA-C  fluticasone (FLONASE) 50 MCG/ACT nasal spray Place 2 sprays into both nostrils daily. 06/24/16 06/27/16 Yes Virgel Manifold, MD  levofloxacin (LEVAQUIN) 500 MG tablet Take 1 tablet (500 mg total) by mouth daily. 06/24/16  Yes Virgel Manifold, MD  loratadine-pseudoephedrine (CLARITIN-D 12 HOUR) 5-120 MG tablet Take 1 tablet by mouth 2 (two) times daily. 06/19/16  Yes Lily Kocher, PA-C  azithromycin (ZITHROMAX) 250 MG tablet 1 po daily with food Patient not taking: Reported on 06/24/2016 06/19/16   Lily Kocher, PA-C    Family History History reviewed. No pertinent family history.  Social History Social History  Substance Use Topics  . Smoking status: Current Every Day Smoker    Packs/day: 1.00    Types: Cigarettes  . Smokeless tobacco: Never Used  . Alcohol use No     Allergies   Penicillins and Shellfish allergy   Review of Systems Review of Systems  All other systems reviewed and are negative.    Physical Exam Updated Vital Signs BP 117/64   Pulse 87  Temp 98.8 F (37.1 C) (Oral)   Resp 20   Ht 5\' 2"  (1.575 m)   Wt 63.5 kg   SpO2 99%   BMI 25.61 kg/m   Physical Exam  Constitutional: She appears well-developed and well-nourished. No distress.  HENT:  Head: Normocephalic and atraumatic.  Right Ear: External ear normal.  Left Ear: External ear normal.  No facial edema noted.  Eyes: Conjunctivae are normal. Right eye exhibits no discharge. Left eye exhibits no discharge. No scleral icterus.  Neck: Neck supple. No tracheal deviation present.  Cardiovascular: Normal rate, regular rhythm and intact distal pulses.     Pulmonary/Chest: Effort normal and breath sounds normal. No stridor. No respiratory distress. She has no wheezes. She has no rales.  Abdominal: Soft. Bowel sounds are normal. She exhibits no distension. There is no tenderness. There is no rebound and no guarding.  Musculoskeletal: She exhibits no edema or tenderness.  Neurological: She is alert. She has normal strength. No cranial nerve deficit (no facial droop, extraocular movements intact, no slurred speech) or sensory deficit. She exhibits normal muscle tone. She displays no seizure activity. Coordination normal.  Skin: Skin is warm and dry. No rash noted.  Psychiatric: She has a normal mood and affect.  Nursing note and vitals reviewed.    ED Treatments / Results  DIAGNOSTIC STUDIES: Oxygen Saturation is 98% on RA, normal by my interpretation.    COORDINATION OF CARE: 8:25 PM Discussed treatment plan with pt at bedside and pt agreed to plan. I will check the patient's labs, EKG, and CXR.  Labs (all labs ordered are listed, but only abnormal results are displayed) Labs Reviewed  CBC - Abnormal; Notable for the following:       Result Value   WBC 13.2 (*)    All other components within normal limits  BASIC METABOLIC PANEL - Abnormal; Notable for the following:    Potassium 3.2 (*)    Calcium 7.9 (*)    All other components within normal limits  TROPONIN I  BRAIN NATRIURETIC PEPTIDE  D-DIMER, QUANTITATIVE (NOT AT [facility])     EKG Interpretation  Date/Time:  Thursday June 27 2016 20:30:16 EDT Ventricular Rate:  79 PR Interval:    QRS Duration: 84 QT Interval:  359 QTC Calculation: 412 R Axis:   55 Text Interpretation:  Sinus rhythm Consider left atrial enlargement No significant change since last tracing Confirmed by Jenina Moening  MD-J, Jarad Barth ([id number]) on 06/27/2016 10:07:21 PM       Radiology Dg Chest 2 View  Result Date: 06/27/2016 CLINICAL DATA:  Shortness of breath, mild productive cough, general chest pain and tightness-  radiates to back, bilateral leg swelling x 2 days. History of asthma. Current smoker. EXAM: CHEST  2 VIEW COMPARISON:  06/18/2016 FINDINGS: Normal heart size and pulmonary vascularity. Slight fibrosis in the lungs with peribronchial thickening consistent with chronic bronchitis. No focal airspace disease or consolidation in the lungs. No blunting of costophrenic angles. No pneumothorax. Mediastinal contours appear intact. IMPRESSION: Chronic bronchitic changes in the lungs. No evidence of active pulmonary disease. Electronically Signed   By: Lucienne Capers M.D.   On: 06/27/2016 21:24    Procedures Procedures (including critical care time)  Medications Ordered in ED Medications - No data to display   Initial Impression / Assessment and Plan / ED Course  I have reviewed the triage vital signs and the nursing notes.  Pertinent labs & imaging results that were available during my care of the patient were  reviewed by me and considered in my medical decision making (see chart for details).   the patient's laboratory tests are reassuring. No evidence of pneumonia. No evidence of congestive heart failure. Low risk for PE and d-dimer is negative.  Suspect her symptoms are related to a viral upper respiratory infection. She has several medications that she was prescribed recently. I think she can continue those medications.  Final Clinical Impressions(s) / ED Diagnoses   Final diagnoses:  Viral upper respiratory tract infection    New Prescriptions New Prescriptions   No medications on file   I personally performed the services described in this documentation, which was scribed in my presence.  The recorded information has been reviewed and is accurate.    Dorie Rank, MD 06/27/16 [phone]

## 2016-06-27 NOTE — Discharge Instructions (Signed)
Into new current medications, follow-up with your primary care doctor

## 2016-06-27 NOTE — ED Triage Notes (Signed)
I feel like I am swelling, get really short winded, get really hot.  They gave me a steroid the other day and I have not been taking it as prescribed because it made me feel bad.  Did not take it today, thought I would feel better today without it but I feel worse.  I feel like an elephant is sitting on my chest.

## 2016-07-01 ENCOUNTER — Encounter (HOSPITAL_COMMUNITY): Payer: Self-pay | Admitting: *Deleted

## 2016-07-01 ENCOUNTER — Emergency Department (HOSPITAL_COMMUNITY)
Admission: EM | Admit: 2016-07-01 | Discharge: 2016-07-01 | Disposition: A | Discharge status: Home / Self Care | Payer: Self-pay | Attending: Emergency Medicine | Admitting: Emergency Medicine

## 2016-07-01 ENCOUNTER — Emergency Department (HOSPITAL_COMMUNITY): Payer: Self-pay

## 2016-07-01 DIAGNOSIS — K5900 Constipation, unspecified: Secondary | ICD-10-CM | POA: Insufficient documentation

## 2016-07-01 DIAGNOSIS — F17210 Nicotine dependence, cigarettes, uncomplicated: Secondary | ICD-10-CM | POA: Insufficient documentation

## 2016-07-01 DIAGNOSIS — J45909 Unspecified asthma, uncomplicated: Secondary | ICD-10-CM | POA: Insufficient documentation

## 2016-07-01 DIAGNOSIS — Z79899 Other long term (current) drug therapy: Secondary | ICD-10-CM | POA: Insufficient documentation

## 2016-07-01 MED ORDER — LACTULOSE 10 GM/15ML PO SOLN: 20.0000 g | Freq: Every day | ORAL | 0 refills | Status: DC | PRN

## 2016-07-01 MED ORDER — SORBITOL 70 % SOLN
960.0000 mL | TOPICAL_OIL | Freq: Once | ORAL | Status: AC
  Administered 2016-07-01: 960 mL via RECTAL
  Filled 2016-07-01: qty 240

## 2016-07-01 MED ORDER — LACTULOSE 10 GM/15ML PO SOLN
20.0000 g | Freq: Once | ORAL | Status: AC
Start: 2016-07-01 — End: 2016-07-01
  Administered 2016-07-01: 20 g via ORAL
  Filled 2016-07-01: qty 30

## 2016-07-01 MED ORDER — ONDANSETRON HCL 4 MG/2ML IJ SOLN
4.0000 mg | Freq: Once | INTRAMUSCULAR | Status: AC
  Administered 2016-07-01: 4 mg via INTRAVENOUS
  Filled 2016-07-01: qty 2

## 2016-07-01 MED ORDER — SODIUM CHLORIDE 0.9 % IV BOLUS (SEPSIS)
1000.0000 mL | Freq: Once | INTRAVENOUS | Status: AC
  Administered 2016-07-01: 1000 mL via INTRAVENOUS

## 2016-07-01 NOTE — ED Triage Notes (Signed)
Pt reports last BM x 2 wks, pt seen at [facility] on Sunday with CT completed, pt reports pain not improving & no bowel movement since, pt c/o abd pain, pt reports vomiting on Saturday, A&O x4

## 2016-07-01 NOTE — ED Notes (Signed)
Pt. Would like to finish her fluids before d/c.

## 2016-07-01 NOTE — ED Provider Notes (Signed)
[facility] DEPT Provider Note   CSN: [id number] Arrival date & time: 07/01/16  1312  By signing my name below, I, [name], attest that this documentation has been prepared under the direction and in the presence of Isla Pence, MD. Electronically Signed: [name], ED Scribe. 07/01/16. 6:38 PM.  History   Chief Complaint Chief Complaint  Patient presents with  . Constipation   The history is provided by the patient. No language interpreter was used.   HPI Comments: Alexis Lucas is a 56 y.o. female who presents to the Emergency Department complaining of persistent, gradually worsening constipation w/ associated generalized abdominal pain beginning two weeks ago. Per pt, her last normal bowel movement was approximately two weeks ago. She has been taking OTC anti-constipation medications and using enemas at home without relief of her constipation. Pt was seen at [facility] for this issue yesterday and at that time a CT which was overall unremarkable but with a large volume of stool. She was prescribed Miralax at that time which she has also been using without relief. Pt additionally notes that she sustained several episodes of nausea and emesis yesterday as well. She denies fever, or any other associated symptoms.    Past Medical History:  Diagnosis Date  . Asthma   . Chronic headaches   . Ovarian tumor    There are no active problems to display for this patient.  Past Surgical History:  Procedure Laterality Date  . ABDOMINAL HYSTERECTOMY    . LEFT OOPHORECTOMY     OB History    No data available     Home Medications    Prior to Admission medications   Medication Sig Start Date End Date Taking? Authorizing Provider  acetaminophen (TYLENOL) 500 MG tablet Take 500 mg by mouth every 6 (six) hours as needed for mild pain or moderate pain.    Historical Provider, MD  albuterol (PROVENTIL HFA;VENTOLIN HFA) 108 (90 BASE) MCG/ACT inhaler Inhale 2 puffs into  the lungs every 6 (six) hours as needed for wheezing or shortness of breath.     Historical Provider, MD  albuterol (PROVENTIL) (2.5 MG/3ML) 0.083% nebulizer solution Take 2.5 mg by nebulization every 6 (six) hours as needed for wheezing or shortness of breath.    Historical Provider, MD  azithromycin (ZITHROMAX) 250 MG tablet 1 po daily with food Patient not taking: Reported on 06/24/2016 06/19/16   Lily Kocher, PA-C  dexamethasone (DECADRON) 4 MG tablet Take 1 tablet (4 mg total) by mouth 2 (two) times daily with a meal. 06/19/16   Lily Kocher, PA-C  fluticasone (FLONASE) 50 MCG/ACT nasal spray Place 2 sprays into both nostrils daily. 06/24/16 06/27/16  Virgel Manifold, MD  lactulose (CHRONULAC) 10 GM/15ML solution Take 30 mLs (20 g total) by mouth daily as needed for moderate constipation. 07/01/16   Isla Pence, MD  levofloxacin (LEVAQUIN) 500 MG tablet Take 1 tablet (500 mg total) by mouth daily. 06/24/16   Virgel Manifold, MD  loratadine-pseudoephedrine (CLARITIN-D 12 HOUR) 5-120 MG tablet Take 1 tablet by mouth 2 (two) times daily. 06/19/16   Lily Kocher, PA-C   Family History No family history on file.  Social History Social History  Substance Use Topics  . Smoking status: Current Every Day Smoker    Packs/day: 1.00    Types: Cigarettes  . Smokeless tobacco: Never Used  . Alcohol use No   Allergies   Penicillins and Shellfish allergy  Review of Systems Review of Systems A complete 10 system  review of systems was obtained and all systems are negative except as noted in the HPI and PMH.   Physical Exam Updated Vital Signs BP 132/70 (BP Location: Left Arm)   Pulse 85   Temp 98.9 F (37.2 C) (Oral)   Resp 18   Ht 5\' 2"  (1.575 m)   Wt 139 lb (63 kg)   SpO2 98%   BMI 25.42 kg/m   Physical Exam  Constitutional: She appears well-developed and well-nourished. No distress.  HENT:  Head: Normocephalic and atraumatic.  Eyes: Conjunctivae are normal.  Neck: Normal range of motion.   Cardiovascular: Normal rate, regular rhythm and normal heart sounds.   No murmur heard. Pulmonary/Chest: Effort normal and breath sounds normal. No respiratory distress. She has no wheezes. She has no rales.  Abdominal: She exhibits no distension. Bowel sounds are decreased. There is generalized tenderness.  Genitourinary: Rectal exam shows no mass.  Genitourinary Comments: Examination chaperoned by tech.  No fecal impaction    Musculoskeletal: Normal range of motion.  Neurological: She is alert.  Skin: No pallor.  Psychiatric: She has a normal mood and affect. Her behavior is normal.  Nursing note and vitals reviewed.  ED Treatments / Results  DIAGNOSTIC STUDIES: Oxygen Saturation is 100% on RA, normal by my interpretation.   COORDINATION OF CARE: 6:31 PM-Discussed next steps with pt. Pt verbalized understanding and is agreeable with the plan.   Labs (all labs ordered are listed, but only abnormal results are displayed) Labs Reviewed - No data to display  EKG  EKG Interpretation None      Radiology Dg Abdomen Acute W/chest  Result Date: 07/01/2016 CLINICAL DATA:  No bowel movements abdominal pain and vomiting EXAM: DG ABDOMEN ACUTE W/ 1V CHEST COMPARISON:  06/27/2016 FINDINGS: Single-view chest demonstrates subsegmental atelectasis at the lung bases. Normal heart size. Supine and upright views of the abdomen demonstrate no free air beneath the diaphragm. Overall nonobstructed gas pattern with gas present in the colon and rectum. Moderate stool. Calcified pelvic phleboliths. No pathologic calcifications. IMPRESSION: 1. Nonobstructed gas pattern 2. Subsegmental atelectasis at the lung bases. Electronically Signed   By: Donavan Foil M.D.   On: 07/01/2016 20:03    Procedures Procedures   Medications Ordered in ED Medications  sorbitol, milk of mag, mineral oil, glycerin (SMOG) enema (not administered)  sodium chloride 0.9 % bolus 1,000 mL (not administered)    ondansetron (ZOFRAN) injection 4 mg (not administered)  lactulose (CHRONULAC) 10 GM/15ML solution 20 g (not administered)    Initial Impression / Assessment and Plan / ED Course  I have reviewed the triage vital signs and the nursing notes.  Pertinent labs & imaging results that were available during my care of the patient were reviewed by me and considered in my medical decision making (see chart for details).    Pt given lactulose and a smog enema.  Pt to be d/c home on lactulose.  Return if worse.  Final Clinical Impressions(s) / ED Diagnoses   Final diagnoses:  Constipation, unspecified constipation type   New Prescriptions New Prescriptions   LACTULOSE (CHRONULAC) 10 GM/15ML SOLUTION    Take 30 mLs (20 g total) by mouth daily as needed for moderate constipation.   I personally performed the services described in this documentation, which was scribed in my presence. The recorded information has been reviewed and is accurate.     Isla Pence, MD 07/04/16 [phone]

## 2016-07-02 ENCOUNTER — Emergency Department (HOSPITAL_COMMUNITY)
Admission: EM | Admit: 2016-07-02 | Discharge: 2016-07-02 | Disposition: A | Discharge status: Home / Self Care | Payer: Self-pay | Attending: Emergency Medicine | Admitting: Emergency Medicine

## 2016-07-02 ENCOUNTER — Encounter (HOSPITAL_COMMUNITY): Payer: Self-pay | Admitting: *Deleted

## 2016-07-02 DIAGNOSIS — F17210 Nicotine dependence, cigarettes, uncomplicated: Secondary | ICD-10-CM | POA: Insufficient documentation

## 2016-07-02 DIAGNOSIS — J45909 Unspecified asthma, uncomplicated: Secondary | ICD-10-CM | POA: Insufficient documentation

## 2016-07-02 DIAGNOSIS — K5900 Constipation, unspecified: Secondary | ICD-10-CM | POA: Insufficient documentation

## 2016-07-02 DIAGNOSIS — Z79899 Other long term (current) drug therapy: Secondary | ICD-10-CM | POA: Insufficient documentation

## 2016-07-02 LAB — URINALYSIS, ROUTINE W REFLEX MICROSCOPIC
BILIRUBIN URINE: NEGATIVE
GLUCOSE, UA: 50 mg/dL — AB
Ketones, ur: 20 mg/dL — AB
NITRITE: NEGATIVE
PH: 5 (ref 5.0–8.0)
Protein, ur: 30 mg/dL — AB
SPECIFIC GRAVITY, URINE: 1.021 (ref 1.005–1.030)

## 2016-07-02 LAB — CBC WITH DIFFERENTIAL/PLATELET
Basophils Absolute: 0 10*3/uL (ref 0.0–0.1)
Basophils Relative: 0 %
EOS ABS: 0 10*3/uL (ref 0.0–0.7)
Eosinophils Relative: 0 %
HEMATOCRIT: 43.4 % (ref 36.0–46.0)
HEMOGLOBIN: 14.7 g/dL (ref 12.0–15.0)
LYMPHS ABS: 1.3 10*3/uL (ref 0.7–4.0)
Lymphocytes Relative: 10 %
MCH: 30.4 pg (ref 26.0–34.0)
MCHC: 33.9 g/dL (ref 30.0–36.0)
MCV: 89.7 fL (ref 78.0–100.0)
MONOS PCT: 7 %
Monocytes Absolute: 0.8 10*3/uL (ref 0.1–1.0)
NEUTROS ABS: 10.6 10*3/uL — AB (ref 1.7–7.7)
NEUTROS PCT: 83 %
Platelets: 232 10*3/uL (ref 150–400)
RBC: 4.84 MIL/uL (ref 3.87–5.11)
RDW: 14.2 % (ref 11.5–15.5)
WBC: 12.7 10*3/uL — ABNORMAL HIGH (ref 4.0–10.5)

## 2016-07-02 LAB — BASIC METABOLIC PANEL
Anion gap: 12 (ref 5–15)
BUN: 9 mg/dL (ref 6–20)
CHLORIDE: 100 mmol/L — AB (ref 101–111)
CO2: 23 mmol/L (ref 22–32)
CREATININE: 0.74 mg/dL (ref 0.44–1.00)
Calcium: 9.3 mg/dL (ref 8.9–10.3)
GFR calc non Af Amer: >60 mL/min (ref >60)
Glucose, Bld: 135 mg/dL — ABNORMAL HIGH (ref 65–99)
Potassium: 4.2 mmol/L (ref 3.5–5.1)
Sodium: 135 mmol/L (ref 135–145)

## 2016-07-02 MED ORDER — OXYCODONE-ACETAMINOPHEN 5-325 MG PO TABS: 1.0000 | ORAL_TABLET | ORAL | 0 refills | Status: DC | PRN

## 2016-07-02 MED ORDER — FENTANYL CITRATE (PF) 100 MCG/2ML IJ SOLN
100.0000 ug | INTRAMUSCULAR | Status: DC | PRN
  Administered 2016-07-02: 100 ug via INTRAVENOUS
  Filled 2016-07-02: qty 2

## 2016-07-02 MED ORDER — SODIUM CHLORIDE 0.9 % IV BOLUS (SEPSIS)
1000.0000 mL | Freq: Once | INTRAVENOUS | Status: AC
  Administered 2016-07-02: 1000 mL via INTRAVENOUS

## 2016-07-02 MED ORDER — LACTULOSE 10 GM/15ML PO SOLN
30.0000 g | Freq: Once | ORAL | Status: AC
  Administered 2016-07-02: 30 g via ORAL
  Filled 2016-07-02: qty 45

## 2016-07-02 MED ORDER — ONDANSETRON HCL 4 MG/2ML IJ SOLN
4.0000 mg | Freq: Once | INTRAMUSCULAR | Status: AC
  Administered 2016-07-02: 4 mg via INTRAVENOUS
  Filled 2016-07-02: qty 2

## 2016-07-02 NOTE — ED Notes (Signed)
Sent for enema from materials

## 2016-07-02 NOTE — ED Notes (Signed)
Pt verbalized understanding discharge instructions and denies any further needs or questions at this time. VS stable, ambulatory and steady gait.   

## 2016-07-02 NOTE — ED Notes (Signed)
Soap suds enema given  Patient unhooked so she can go to restroom

## 2016-07-02 NOTE — ED Notes (Signed)
Patient unable to have a bowel movement after the enema. Dr. Eulis Foster notified

## 2016-07-02 NOTE — ED Provider Notes (Signed)
[facility] DEPT Provider Note   CSN: [id number] Arrival date & time: 07/02/16  6834     History   Chief Complaint Chief Complaint  Patient presents with  . Abdominal Pain  . Rectal Bleeding    HPI Alexis Lucas is a 56 y.o. female.   She presents for evaluation of abdominal pain, and no stooling despite treatment yesterday, for constipation.  She states that she has passed some blood, after the enema yesterday.  She has nausea but no vomiting.  Currently in the ED, she is hungry.  She denies fever nausea vomiting or weakness.  Yesterday she was prescribed lactulose, but has not started it yet.  There are no other known modifying factors.  HPI  Past Medical History:  Diagnosis Date  . Asthma   . Chronic headaches   . Ovarian tumor     There are no active problems to display for this patient.   Past Surgical History:  Procedure Laterality Date  . ABDOMINAL HYSTERECTOMY    . LEFT OOPHORECTOMY      OB History    No data available       Home Medications    Prior to Admission medications   Medication Sig Start Date End Date Taking? Authorizing Provider  acetaminophen (TYLENOL) 500 MG tablet Take 500 mg by mouth every 6 (six) hours as needed for mild pain or moderate pain.   Yes Historical Provider, MD  albuterol (PROVENTIL HFA;VENTOLIN HFA) 108 (90 BASE) MCG/ACT inhaler Inhale 2 puffs into the lungs every 6 (six) hours as needed for wheezing or shortness of breath.    Yes Historical Provider, MD  albuterol (PROVENTIL) (2.5 MG/3ML) 0.083% nebulizer solution Take 2.5 mg by nebulization every 6 (six) hours as needed for wheezing or shortness of breath.   Yes Historical Provider, MD  azithromycin (ZITHROMAX) 250 MG tablet 1 po daily with food 06/19/16  Yes Lily Kocher, PA-C  loratadine-pseudoephedrine (CLARITIN-D 12 HOUR) 5-120 MG tablet Take 1 tablet by mouth 2 (two) times daily. 06/19/16  Yes Lily Kocher, PA-C  traMADol (ULTRAM) 50 MG tablet Take 50 mg by mouth  every 6 (six) hours as needed for severe pain.   Yes Historical Provider, MD  dexamethasone (DECADRON) 4 MG tablet Take 1 tablet (4 mg total) by mouth 2 (two) times daily with a meal. Patient not taking: Reported on 07/02/2016 06/19/16   Lily Kocher, PA-C  fluticasone (FLONASE) 50 MCG/ACT nasal spray Place 2 sprays into both nostrils daily. 06/24/16 06/27/16  Virgel Manifold, MD  lactulose (CHRONULAC) 10 GM/15ML solution Take 30 mLs (20 g total) by mouth daily as needed for moderate constipation. 07/01/16   Isla Pence, MD  levofloxacin (LEVAQUIN) 500 MG tablet Take 1 tablet (500 mg total) by mouth daily. Patient not taking: Reported on 07/02/2016 06/24/16   Virgel Manifold, MD    Family History No family history on file.  Social History Social History  Substance Use Topics  . Smoking status: Current Every Day Smoker    Packs/day: 1.00    Types: Cigarettes  . Smokeless tobacco: Never Used  . Alcohol use No     Allergies   Penicillins and Shellfish allergy   Review of Systems Review of Systems  All other systems reviewed and are negative.    Physical Exam Updated Vital Signs BP (!) 159/73   Pulse 97   Temp 98.4 F (36.9 C) (Oral)   Resp 18   SpO2 100%   Physical Exam  Constitutional: She is oriented  to person, place, and time. She appears well-developed and well-nourished. No distress.  HENT:  Head: Normocephalic and atraumatic.  Eyes: Conjunctivae and EOM are normal. Pupils are equal, round, and reactive to light.  Neck: Normal range of motion and phonation normal. Neck supple.  Cardiovascular: Normal rate and regular rhythm.   Pulmonary/Chest: Effort normal and breath sounds normal. She exhibits no tenderness.  Abdominal: Soft. She exhibits no distension. There is tenderness (Left upper and lower, mild). There is no guarding.  Musculoskeletal: Normal range of motion.  Neurological: She is alert and oriented to person, place, and time. She exhibits normal muscle tone.    Skin: Skin is warm and dry.  Psychiatric: She has a normal mood and affect. Her behavior is normal. Judgment and thought content normal.  Nursing note and vitals reviewed.    ED Treatments / Results  Labs (all labs ordered are listed, but only abnormal results are displayed) Labs Reviewed  URINALYSIS, ROUTINE W REFLEX MICROSCOPIC - Abnormal; Notable for the following:       Result Value   APPearance HAZY (*)    Glucose, UA 50 (*)    Hgb urine dipstick SMALL (*)    Ketones, ur 20 (*)    Protein, ur 30 (*)    Leukocytes, UA TRACE (*)    Bacteria, UA FEW (*)    Squamous Epithelial / LPF 0-5 (*)    All other components within normal limits  BASIC METABOLIC PANEL - Abnormal; Notable for the following:    Chloride 100 (*)    Glucose, Bld 135 (*)    All other components within normal limits  CBC WITH DIFFERENTIAL/PLATELET - Abnormal; Notable for the following:    WBC 12.7 (*)    Neutro Abs 10.6 (*)    All other components within normal limits    EKG  EKG Interpretation None       Radiology Dg Abdomen Acute W/chest  Result Date: 07/01/2016 CLINICAL DATA:  No bowel movements abdominal pain and vomiting EXAM: DG ABDOMEN ACUTE W/ 1V CHEST COMPARISON:  06/27/2016 FINDINGS: Single-view chest demonstrates subsegmental atelectasis at the lung bases. Normal heart size. Supine and upright views of the abdomen demonstrate no free air beneath the diaphragm. Overall nonobstructed gas pattern with gas present in the colon and rectum. Moderate stool. Calcified pelvic phleboliths. No pathologic calcifications. IMPRESSION: 1. Nonobstructed gas pattern 2. Subsegmental atelectasis at the lung bases. Electronically Signed   By: Donavan Foil M.D.   On: 07/01/2016 20:03    Procedures Procedures (including critical care time)  Medications Ordered in ED Medications  fentaNYL (SUBLIMAZE) injection 100 mcg (100 mcg Intravenous Given 07/02/16 1045)  sodium chloride 0.9 % bolus 1,000 mL (0 mLs  Intravenous Stopped 07/02/16 1508)  ondansetron (ZOFRAN) injection 4 mg (4 mg Intravenous Given 07/02/16 1045)  lactulose (CHRONULAC) 10 GM/15ML solution 30 g (30 g Oral Given 07/02/16 1546)     Initial Impression / Assessment and Plan / ED Course  I have reviewed the triage vital signs and the nursing notes.  Pertinent labs & imaging results that were available during my care of the patient were reviewed by me and considered in my medical decision making (see chart for details).  Clinical Course as of Jul 03 1554  Tue Jul 02, 2016  1038 She complains of rectal bleeding, following treatment with enema yesterday.  She has not started lactulose which was prescribed for severe constipation.  She reports no bowel movements since the enema yesterday.  She  has bleeding when attempting to have a bowel movement.  [EW]  2778 Anal examination-small external hemorrhoid, not bleeding.  No visible or palpable anal fissure.  No stool or blood in the rectum.  [EW]    Clinical Course User Index [EW] Daleen Bo, MD   Medications  fentaNYL (SUBLIMAZE) injection 100 mcg (100 mcg Intravenous Given 07/02/16 1045)  sodium chloride 0.9 % bolus 1,000 mL (0 mLs Intravenous Stopped 07/02/16 1508)  ondansetron (ZOFRAN) injection 4 mg (4 mg Intravenous Given 07/02/16 1045)  lactulose (CHRONULAC) 10 GM/15ML solution 30 g (30 g Oral Given 07/02/16 1546)    Patient Vitals for the past 24 hrs:  BP Temp Temp src Pulse Resp SpO2  07/02/16 1530 (!) 159/73 - - 97 18 100 %  07/02/16 1500 (!) 182/84 - - 78 18 100 %  07/02/16 1330 (!) 179/103 - - 88 15 99 %  07/02/16 1230 (!) 178/90 - - 79 15 99 %  07/02/16 1015 (!) 182/93 - - 88 19 99 %  07/02/16 1000 (!) 185/98 - - (!) 25 11 (!) 88 %  07/02/16 0746 (!) 183/92 98.4 F (36.9 C) Oral (!) 105 16 100 %    3:58 PM Reevaluation with update and discussion. After initial assessment and treatment, an updated evaluation reveals she has not gotten a good bowel movement since the  enema.  She has passed some bright red blood.  Currently she is sitting on a commode, and is fairly comfortable.  Findings discussed with the patient and all questions were answered. Symphony Demuro L    Final Clinical Impressions(s) / ED Diagnoses   Final diagnoses:  Constipation, unspecified constipation type   Abdominal pain, with constipation, but no signs of obstruction.  She had a recent CT image done at [facility] several days ago.  Doubt bowel obstruction, colitis, perforated intestine.  No evidence for serious bacterial infection, significant blood loss, or impending vascular collapse.  Nursing Notes Reviewed/ Care Coordinated Applicable Imaging Reviewed Interpretation of Laboratory Data incorporated into ED treatment  The patient appears reasonably screened and/or stabilized for discharge and I doubt any other medical condition or other [facility] requiring further screening, evaluation, or treatment in the ED at this time prior to discharge.  Plan: Home Medications-change tramadol to Percocet, use lactulose 3 times daily and tell having a bowel movement, then once a day until gone.  Follow that with Colace 100 mg twice daily; Home Treatments-fluids and fiber, and diet; return here if the recommended treatment, does not improve the symptoms; Recommended follow up-return here or see PCP if not better in 2 days.    New Prescriptions New Prescriptions   No medications on file     Daleen Bo, MD 07/02/16 1721

## 2016-07-02 NOTE — Discharge Instructions (Signed)
Use the lactulose, which you were prescribed yesterday 3 times a day until you have any bowel movement, then once or twice a day, until gone.  After that take Colace 100 mg twice a day to help keep her stools soft.  Make sure you are drinking plenty of fluids, especially water.  Return here or see your doctor if not better in 2 days

## 2016-07-02 NOTE — ED Notes (Signed)
Pt wont get off the toilet.  Just waiting for pt to sign and leave the room.

## 2017-05-07 ENCOUNTER — Emergency Department (HOSPITAL_COMMUNITY): Payer: Self-pay

## 2017-05-07 ENCOUNTER — Other Ambulatory Visit: Payer: Self-pay

## 2017-05-07 ENCOUNTER — Encounter (HOSPITAL_COMMUNITY): Payer: Self-pay | Admitting: Emergency Medicine

## 2017-05-07 ENCOUNTER — Emergency Department (HOSPITAL_COMMUNITY)
Admission: EM | Admit: 2017-05-07 | Discharge: 2017-05-07 | Disposition: A | Discharge status: Home / Self Care | Payer: Self-pay | Attending: Emergency Medicine | Admitting: Emergency Medicine

## 2017-05-07 DIAGNOSIS — Z79899 Other long term (current) drug therapy: Secondary | ICD-10-CM | POA: Insufficient documentation

## 2017-05-07 DIAGNOSIS — R0602 Shortness of breath: Secondary | ICD-10-CM | POA: Insufficient documentation

## 2017-05-07 DIAGNOSIS — F17210 Nicotine dependence, cigarettes, uncomplicated: Secondary | ICD-10-CM | POA: Insufficient documentation

## 2017-05-07 DIAGNOSIS — R9431 Abnormal electrocardiogram [ECG] [EKG]: Secondary | ICD-10-CM | POA: Insufficient documentation

## 2017-05-07 DIAGNOSIS — J45909 Unspecified asthma, uncomplicated: Secondary | ICD-10-CM | POA: Insufficient documentation

## 2017-05-07 LAB — CBC
HCT: 37.2 % (ref 36.0–46.0)
Hemoglobin: 12.2 g/dL (ref 12.0–15.0)
MCH: 30.3 pg (ref 26.0–34.0)
MCHC: 32.8 g/dL (ref 30.0–36.0)
MCV: 92.5 fL (ref 78.0–100.0)
Platelets: 158 10*3/uL (ref 150–400)
RBC: 4.02 MIL/uL (ref 3.87–5.11)
RDW: 14.1 % (ref 11.5–15.5)
WBC: 5.6 10*3/uL (ref 4.0–10.5)

## 2017-05-07 LAB — BASIC METABOLIC PANEL
ANION GAP: 10 (ref 5–15)
BUN: 9 mg/dL (ref 6–20)
CALCIUM: 9 mg/dL (ref 8.9–10.3)
CO2: 22 mmol/L (ref 22–32)
CREATININE: 0.74 mg/dL (ref 0.44–1.00)
Chloride: 108 mmol/L (ref 101–111)
GFR calc non Af Amer: >60 mL/min (ref >60)
Glucose, Bld: 94 mg/dL (ref 65–99)
Potassium: 3.5 mmol/L (ref 3.5–5.1)
SODIUM: 140 mmol/L (ref 135–145)

## 2017-05-07 LAB — TROPONIN I: Troponin I: <0.03 ng/mL (ref <0.03)

## 2017-05-07 LAB — D-DIMER, QUANTITATIVE (NOT AT ARMC): D DIMER QUANT: 0.29 ug{FEU}/mL (ref 0.00–0.50)

## 2017-05-07 MED ORDER — ALBUTEROL SULFATE (2.5 MG/3ML) 0.083% IN NEBU
5.0000 mg | INHALATION_SOLUTION | Freq: Once | RESPIRATORY_TRACT | Status: AC
  Administered 2017-05-07: 5 mg via RESPIRATORY_TRACT
  Filled 2017-05-07: qty 6

## 2017-05-07 MED ORDER — PREDNISONE 20 MG PO TABS: 40.0000 mg | ORAL_TABLET | Freq: Every day | ORAL | 0 refills | Status: DC

## 2017-05-07 NOTE — ED Notes (Signed)
Pt able to speak clear and complete sentences in triage.no dyspnea noted.

## 2017-05-07 NOTE — ED Triage Notes (Signed)
Pt reports shortness of breath since Monday. Pt reports used at home neb and inhaler with no relief. Pt reports intermittent productive cough. Pt denies any chest pain but reports chest tightness.

## 2017-05-07 NOTE — Discharge Instructions (Signed)
Please take prednisone daily, 40 mg per dose, take with food and water Albuterol inhaler 2 puffs every 4 hours for the next 24 hours, then 2 puffs every 4 hours as needed Seek medical attention for severe or worsening pain swelling difficulty breathing fevers or any severe or worsening symptoms. You must have a follow-up with a cardiologist as you have an abnormal EKG.  Please call for an appointment to be seen in the next 3 days

## 2017-05-07 NOTE — ED Provider Notes (Signed)
[facility] EMERGENCY DEPARTMENT Provider Note   CSN: [id number] Arrival date & time: 05/07/17  1326     History   Chief Complaint Chief Complaint  Patient presents with  . Shortness of Breath    HPI Alexis Lucas is a 57 y.o. female.  HPI  The patient is a 57 year old female with a known history of asthma, she reports that when she used to work in the [employer] she would become short of breath frequently with ongoing asthma exacerbations but since quitting has not had somewhat shortness of breath.  She is currently unemployed but states that over the last couple of days she has had increasing shortness of breath with a tightness in her chest, she had some coughing yesterday but that has improved today and she has a subjective feeling of fevers and chills intermittently.  She denies any leg swelling, she denies any exertional symptoms of chest pain but does state that she gets more tight and short of breath when she walks.  She relates this to similar feelings with her asthma exacerbations though she states she has been using her albuterol treatments without any improvement at home.  She does report unilateral nose congestion which is mild but denies any sore throat, headache, radiation of chest tightness to the shoulders arms or neck.  She takes no other daily medications other than her albuterol and has had no risk factors for pulmonary embolism other than her tobacco use.  She currently smokes 1 pack/day and also enjoys marijuana.  Past Medical History:  Diagnosis Date  . Asthma   . Chronic headaches   . Ovarian tumor     There are no active problems to display for this patient.   Past Surgical History:  Procedure Laterality Date  . ABDOMINAL HYSTERECTOMY    . LEFT OOPHORECTOMY      OB History    No data available       Home Medications    Prior to Admission medications   Medication Sig Start Date End Date Taking? Authorizing Provider  acetaminophen (TYLENOL)  500 MG tablet Take 500 mg by mouth every 6 (six) hours as needed for mild pain or moderate pain.   Yes [provider]  albuterol (PROVENTIL HFA;VENTOLIN HFA) 108 (90 BASE) MCG/ACT inhaler Inhale 2 puffs into the lungs every 6 (six) hours as needed for wheezing or shortness of breath.    Yes [provider]  albuterol (PROVENTIL) (2.5 MG/3ML) 0.083% nebulizer solution Take 2.5 mg by nebulization every 6 (six) hours as needed for wheezing or shortness of breath.   Yes [provider]  fluticasone (FLONASE) 50 MCG/ACT nasal spray Place 2 sprays into both nostrils daily. 06/24/16 05/07/17 Yes Virgel Manifold, MD  lactulose (CHRONULAC) 10 GM/15ML solution Take 30 mLs (20 g total) by mouth daily as needed for moderate constipation. 07/01/16  Yes Isla Pence, MD  loratadine-pseudoephedrine (CLARITIN-D 12 HOUR) 5-120 MG tablet Take 1 tablet by mouth 2 (two) times daily. 06/19/16  Yes Lily Kocher, PA-C  oxyCODONE-acetaminophen (PERCOCET) 5-325 MG tablet Take 1 tablet by mouth every 4 (four) hours as needed for severe pain. 07/02/16  Yes Daleen Bo, MD  traMADol (ULTRAM) 50 MG tablet Take 50 mg by mouth every 6 (six) hours as needed for severe pain.   Yes [provider]  predniSONE (DELTASONE) 20 MG tablet Take 2 tablets (40 mg total) by mouth daily. 05/07/17   Noemi Chapel, MD    Family History History reviewed. No pertinent  family history.  Social History Social History   Tobacco Use  . Smoking status: Current Every Day Smoker    Packs/day: 1.00    Types: Cigarettes  . Smokeless tobacco: Never Used  Substance Use Topics  . Alcohol use: No  . Drug use: Yes    Types: Marijuana    Comment: occasional     Allergies   Penicillins and Shellfish allergy   Review of Systems Review of Systems  All other systems reviewed and are negative.    Physical Exam Updated Vital Signs BP (!) 140/59 (BP Location: Left Arm)   Pulse 72   Temp 98.9 F (37.2 C)  (Oral)   Resp 19   Ht 5\' 2"  (1.575 m)   Wt 64.9 kg (143 lb)   SpO2 100%   BMI 26.16 kg/m   Physical Exam  Constitutional: She appears well-developed and well-nourished. No distress.  HENT:  Head: Normocephalic and atraumatic.  Mouth/Throat: Oropharynx is clear and moist. No oropharyngeal exudate.  Eyes: Conjunctivae and EOM are normal. Pupils are equal, round, and reactive to light. Right eye exhibits no discharge. Left eye exhibits no discharge. No scleral icterus.  Neck: Normal range of motion. Neck supple. No JVD present. No thyromegaly present.  Cardiovascular: Regular rhythm, normal heart sounds and intact distal pulses. Exam reveals no gallop and no friction rub.  No murmur heard. Mild tachycardia, normal pulses, no JVD or peripheral edema  Pulmonary/Chest: Effort normal and breath sounds normal. No respiratory distress. She has no wheezes. She has no rales.  Abdominal: Soft. Bowel sounds are normal. She exhibits no distension and no mass. There is no tenderness.  Musculoskeletal: Normal range of motion. She exhibits no edema or tenderness.  Lymphadenopathy:    She has no cervical adenopathy.  Neurological: She is alert. Coordination normal.  Skin: Skin is warm and dry. No rash noted. No erythema.  Psychiatric: She has a normal mood and affect. Her behavior is normal.  Nursing note and vitals reviewed.    ED Treatments / Results  Labs (all labs ordered are listed, but only abnormal results are displayed) Labs Reviewed  CBC  BASIC METABOLIC PANEL  TROPONIN I  D-DIMER, QUANTITATIVE (NOT AT [facility])  TROPONIN I    EKG  EKG Interpretation  Date/Time:  Wednesday May 07 2017 13:48:48 EST Ventricular Rate:  104 PR Interval:  166 QRS Duration: 90 QT Interval:  344 QTC Calculation: 452 R Axis:   77 Text Interpretation:  Sinus tachycardia Biatrial enlargement Abnormal ECG since prior tracing Q waves now present in inferior and precordial leads Confirmed by Noemi Chapel [phone]) on 05/07/2017 3:26:38 PM       Radiology Dg Chest 2 View  Result Date: 05/07/2017 CLINICAL DATA:  Chest pain and shortness of breath since this past Monday. History of asthma. Current smoker. EXAM: CHEST  2 VIEW COMPARISON:  07/01/2016; 06/18/2016; 01/27/2015 FINDINGS: Grossly unchanged cardiac silhouette and mediastinal contours. Improved aeration of the lungs with minimal residual interstitial thickening and bilateral mid and lower lung linear heterogeneous opacities. No new focal airspace opacities. No pleural effusion or pneumothorax. No evidence of edema. No acute osseous abnormalities. IMPRESSION: Improved aeration of lungs with findings suggestive of mild airways disease/bronchitis and bibasilar atelectasis. Electronically Signed   By: Sandi Mariscal M.D.   On: 05/07/2017 14:28    Procedures Procedures (including critical care time)  Medications Ordered in ED Medications  albuterol (PROVENTIL) (2.5 MG/3ML) 0.083% nebulizer solution 5 mg (5 mg Nebulization Given 05/07/17 1620)  Initial Impression / Assessment and Plan / ED Course  I have reviewed the triage vital signs and the nursing notes.  Pertinent labs & imaging results that were available during my care of the patient were reviewed by me and considered in my medical decision making (see chart for details).     Patient's exam is unremarkable except for mild tachycardia.  She has no other risk factors for pulmonary embolism other than her tobacco use, mild tachycardia and shortness of breath with normal lung sounds.  This makes it less likely to be asthma, I would also consider a cardiac etiology especially given the new abnormal Q waves on the EKG.  Chest x-ray was unremarkable with no signs of pneumonia, labs have been ordered.  Repeat troponin is negative, the patient has no chest pain, her shortness of breath improved with a nebulizer, labs were totally unremarkable including a d-dimer and the chest x-ray  revealed no acute findings.  I made it very clear to the patient that she needs to see the cardiologist in follow-up given her abnormal EKG.  I am suspicious that her shortness of breath is more related to her asthma however at this time it does not appear to be a pulmonary embolism and is not having an acute infarct.  She has agreed to follow-up with the cardiologist.  Follow-up phone number given  Final Clinical Impressions(s) / ED Diagnoses   Final diagnoses:  Shortness of breath  Abnormal EKG    ED Discharge Orders        Ordered    predniSONE (DELTASONE) 20 MG tablet  Daily     05/07/17 1920       Noemi Chapel, MD 05/07/17 [phone]

## 2017-09-28 ENCOUNTER — Emergency Department (HOSPITAL_COMMUNITY): Payer: Self-pay

## 2017-09-28 ENCOUNTER — Emergency Department (HOSPITAL_COMMUNITY)
Admission: EM | Admit: 2017-09-28 | Discharge: 2017-09-29 | Disposition: A | Discharge status: Home / Self Care | Payer: Self-pay | Attending: Emergency Medicine | Admitting: Emergency Medicine

## 2017-09-28 ENCOUNTER — Other Ambulatory Visit: Payer: Self-pay

## 2017-09-28 ENCOUNTER — Encounter (HOSPITAL_COMMUNITY): Payer: Self-pay | Admitting: Emergency Medicine

## 2017-09-28 DIAGNOSIS — J45909 Unspecified asthma, uncomplicated: Secondary | ICD-10-CM | POA: Insufficient documentation

## 2017-09-28 DIAGNOSIS — R0602 Shortness of breath: Secondary | ICD-10-CM | POA: Insufficient documentation

## 2017-09-28 DIAGNOSIS — F17210 Nicotine dependence, cigarettes, uncomplicated: Secondary | ICD-10-CM | POA: Insufficient documentation

## 2017-09-28 DIAGNOSIS — R509 Fever, unspecified: Secondary | ICD-10-CM | POA: Insufficient documentation

## 2017-09-28 DIAGNOSIS — F1210 Cannabis abuse, uncomplicated: Secondary | ICD-10-CM | POA: Insufficient documentation

## 2017-09-28 DIAGNOSIS — Z79899 Other long term (current) drug therapy: Secondary | ICD-10-CM | POA: Insufficient documentation

## 2017-09-28 DIAGNOSIS — R059 Cough, unspecified: Secondary | ICD-10-CM

## 2017-09-28 DIAGNOSIS — R0789 Other chest pain: Secondary | ICD-10-CM | POA: Insufficient documentation

## 2017-09-28 DIAGNOSIS — R05 Cough: Secondary | ICD-10-CM | POA: Insufficient documentation

## 2017-09-28 DIAGNOSIS — J189 Pneumonia, unspecified organism: Secondary | ICD-10-CM | POA: Insufficient documentation

## 2017-09-28 HISTORY — DX: Radiculopathy, cervical region: M54.12

## 2017-09-28 HISTORY — DX: Cervicalgia: M54.2

## 2017-09-28 LAB — URINALYSIS, ROUTINE W REFLEX MICROSCOPIC
Bilirubin Urine: NEGATIVE
GLUCOSE, UA: NEGATIVE mg/dL
HGB URINE DIPSTICK: NEGATIVE
Ketones, ur: NEGATIVE mg/dL
LEUKOCYTES UA: NEGATIVE
Nitrite: NEGATIVE
PROTEIN: NEGATIVE mg/dL
Specific Gravity, Urine: 1.023 (ref 1.005–1.030)
pH: 6 (ref 5.0–8.0)

## 2017-09-28 LAB — COMPREHENSIVE METABOLIC PANEL
ALT: 17 U/L (ref 14–54)
AST: 30 U/L (ref 15–41)
Albumin: 4.3 g/dL (ref 3.5–5.0)
Alkaline Phosphatase: 72 U/L (ref 38–126)
Anion gap: 9 (ref 5–15)
BUN: 9 mg/dL (ref 6–20)
CHLORIDE: 106 mmol/L (ref 101–111)
CO2: 22 mmol/L (ref 22–32)
Calcium: 9 mg/dL (ref 8.9–10.3)
Creatinine, Ser: 0.72 mg/dL (ref 0.44–1.00)
Glucose, Bld: 139 mg/dL — ABNORMAL HIGH (ref 65–99)
Potassium: 3.4 mmol/L — ABNORMAL LOW (ref 3.5–5.1)
SODIUM: 137 mmol/L (ref 135–145)
Total Bilirubin: 0.5 mg/dL (ref 0.3–1.2)
Total Protein: 7.6 g/dL (ref 6.5–8.1)

## 2017-09-28 LAB — CBC WITH DIFFERENTIAL/PLATELET
Basophils Absolute: 0 10*3/uL (ref 0.0–0.1)
Basophils Relative: 0 %
Eosinophils Absolute: 0.3 10*3/uL (ref 0.0–0.7)
Eosinophils Relative: 3 %
HEMATOCRIT: 38.6 % (ref 36.0–46.0)
HEMOGLOBIN: 12.6 g/dL (ref 12.0–15.0)
LYMPHS ABS: 1.3 10*3/uL (ref 0.7–4.0)
LYMPHS PCT: 15 %
MCH: 29.9 pg (ref 26.0–34.0)
MCHC: 32.6 g/dL (ref 30.0–36.0)
MCV: 91.5 fL (ref 78.0–100.0)
Monocytes Absolute: 0.5 10*3/uL (ref 0.1–1.0)
Monocytes Relative: 6 %
NEUTROS ABS: 6.4 10*3/uL (ref 1.7–7.7)
Neutrophils Relative %: 76 %
Platelets: 151 10*3/uL (ref 150–400)
RBC: 4.22 MIL/uL (ref 3.87–5.11)
RDW: 13.4 % (ref 11.5–15.5)
WBC: 8.5 10*3/uL (ref 4.0–10.5)

## 2017-09-28 LAB — TROPONIN I

## 2017-09-28 LAB — MAGNESIUM: Magnesium: 1.7 mg/dL (ref 1.7–2.4)

## 2017-09-28 LAB — LACTIC ACID, PLASMA: LACTIC ACID, VENOUS: 1.2 mmol/L (ref 0.5–1.9)

## 2017-09-28 LAB — D-DIMER, QUANTITATIVE: D-Dimer, Quant: 0.53 ug/mL-FEU — ABNORMAL HIGH (ref 0.00–0.50)

## 2017-09-28 MED ORDER — ACETAMINOPHEN 325 MG PO TABS
650.0000 mg | ORAL_TABLET | Freq: Once | ORAL | Status: AC
  Administered 2017-09-28: 650 mg via ORAL
  Filled 2017-09-28: qty 2

## 2017-09-28 MED ORDER — SODIUM CHLORIDE 0.9 % IV BOLUS
1000.0000 mL | Freq: Once | INTRAVENOUS | Status: AC
  Administered 2017-09-28: 1000 mL via INTRAVENOUS

## 2017-09-28 MED ORDER — ALBUTEROL SULFATE (2.5 MG/3ML) 0.083% IN NEBU
2.5000 mg | INHALATION_SOLUTION | Freq: Once | RESPIRATORY_TRACT | Status: AC
  Administered 2017-09-28: 2.5 mg via RESPIRATORY_TRACT
  Filled 2017-09-28: qty 3

## 2017-09-28 MED ORDER — MAGNESIUM OXIDE 400 (241.3 MG) MG PO TABS
400.0000 mg | ORAL_TABLET | Freq: Once | ORAL | Status: AC
  Administered 2017-09-28: 400 mg via ORAL
  Filled 2017-09-28: qty 1

## 2017-09-28 MED ORDER — IOPAMIDOL (ISOVUE-370) INJECTION 76%
100.0000 mL | Freq: Once | INTRAVENOUS | Status: AC | PRN
  Administered 2017-09-28: 100 mL via INTRAVENOUS

## 2017-09-28 MED ORDER — POTASSIUM CHLORIDE CRYS ER 20 MEQ PO TBCR
40.0000 meq | EXTENDED_RELEASE_TABLET | Freq: Once | ORAL | Status: AC
  Administered 2017-09-28: 40 meq via ORAL
  Filled 2017-09-28: qty 2

## 2017-09-28 MED ORDER — IPRATROPIUM-ALBUTEROL 0.5-2.5 (3) MG/3ML IN SOLN
3.0000 mL | Freq: Once | RESPIRATORY_TRACT | Status: AC
  Administered 2017-09-28: 3 mL via RESPIRATORY_TRACT
  Filled 2017-09-28: qty 3

## 2017-09-28 NOTE — ED Notes (Signed)
Pt transported to CT ?

## 2017-09-28 NOTE — ED Provider Notes (Signed)
[facility] EMERGENCY DEPARTMENT Provider Note   CSN: [id number] Arrival date & time: 09/28/17  2055     History   Chief Complaint Chief Complaint  Patient presents with  . Chest Pain  . Cough    HPI Alexis Lucas is a 57 y.o. female.  HPI  Pt was seen at 2120. Per pt, c/o gradual onset and persistence of constant left sided chest "pain" for the past 2 days. Has been associated with cough, wheezing, and SOB. Describes the pain as constant and "aching," Pt also c/o "cramping all over." Pt states she used her nebulizer and MDI without improvement of her symptoms. Denies palpitations, no back pain, no abd pain, no N/V/D, no fevers, no rash, no injury.   Past Medical History:  Diagnosis Date  . Asthma   . Cervical radiculopathy   . Chronic headaches   . Neck pain   . Ovarian tumor     There are no active problems to display for this patient.   Past Surgical History:  Procedure Laterality Date  . ABDOMINAL HYSTERECTOMY    . LEFT OOPHORECTOMY       OB History   None      Home Medications    Prior to Admission medications   Medication Sig Start Date End Date Taking? Authorizing Provider  acetaminophen (TYLENOL) 500 MG tablet Take 500 mg by mouth every 6 (six) hours as needed for mild pain or moderate pain.    [provider]  albuterol (PROVENTIL HFA;VENTOLIN HFA) 108 (90 BASE) MCG/ACT inhaler Inhale 2 puffs into the lungs every 6 (six) hours as needed for wheezing or shortness of breath.     [provider]  albuterol (PROVENTIL) (2.5 MG/3ML) 0.083% nebulizer solution Take 2.5 mg by nebulization every 6 (six) hours as needed for wheezing or shortness of breath.    [provider]  fluticasone (FLONASE) 50 MCG/ACT nasal spray Place 2 sprays into both nostrils daily. 06/24/16 05/07/17  Virgel Manifold, MD  lactulose (CHRONULAC) 10 GM/15ML solution Take 30 mLs (20 g total) by mouth daily as needed for moderate constipation. 07/01/16   Isla Pence, MD  loratadine-pseudoephedrine (CLARITIN-D 12 HOUR) 5-120 MG tablet Take 1 tablet by mouth 2 (two) times daily. 06/19/16   Lily Kocher, PA-C  oxyCODONE-acetaminophen (PERCOCET) 5-325 MG tablet Take 1 tablet by mouth every 4 (four) hours as needed for severe pain. 07/02/16   Daleen Bo, MD  predniSONE (DELTASONE) 20 MG tablet Take 2 tablets (40 mg total) by mouth daily. 05/07/17   Noemi Chapel, MD  traMADol (ULTRAM) 50 MG tablet Take 50 mg by mouth every 6 (six) hours as needed for severe pain.    [provider]    Family History History reviewed. No pertinent family history.  Social History Social History   Tobacco Use  . Smoking status: Current Every Day Smoker    Packs/day: 1.00    Types: Cigarettes  . Smokeless tobacco: Never Used  Substance Use Topics  . Alcohol use: No  . Drug use: Yes    Types: Marijuana    Comment: occasional     Allergies   Penicillins and Shellfish allergy   Review of Systems Review of Systems ROS: Statement: All systems negative except as marked or noted in the HPI; Constitutional: Negative for fever and chills. ; ; Eyes: Negative for eye pain, redness and discharge. ; ; ENMT: Negative for ear pain, hoarseness, nasal congestion, sinus pressure and sore throat. ; ; Cardiovascular: +CP.  Negative for palpitations, diaphoresis, and peripheral edema. ; ; Respiratory: +cough, wheezing, SOB. Negative for stridor. ; ; Gastrointestinal: Negative for nausea, vomiting, diarrhea, abdominal pain, blood in stool, hematemesis, jaundice and rectal bleeding. . ; ; Genitourinary: Negative for dysuria, flank pain and hematuria. ; ; Musculoskeletal: +"muscles cramping." Negative for back pain and neck pain. Negative for swelling and trauma.; ; Skin: Negative for pruritus, rash, abrasions, blisters, bruising and skin lesion.; ; Neuro: Negative for headache, lightheadedness and neck stiffness. Negative for weakness, altered level of consciousness, altered  mental status, extremity weakness, paresthesias, involuntary movement, seizure and syncope.       Physical Exam Updated Vital Signs BP 129/63   Pulse 99   Temp (!) 100.9 F (38.3 C) (Rectal)   Resp (!) 26   Ht 5\' 2"  (1.575 m)   Wt 63.5 kg (140 lb)   SpO2 94%   BMI 25.61 kg/m    Patient Vitals for the past 24 hrs:  BP Temp Temp src Pulse Resp SpO2 Height Weight  09/28/17 2236 - (!) 100.9 F (38.3 C) Rectal - - - - -  09/28/17 2230 129/63 - - 99 (!) 26 94 % - -  09/28/17 2200 140/75 - - 91 (!) 26 99 % - -  09/28/17 2131 140/66 - - 88 (!) 23 94 % - -  09/28/17 2119 - (!) 101.9 F (38.8 C) Rectal - - - - -  09/28/17 2111 (!) 167/79 100.3 F (37.9 C) Oral 95 17 97 % - -  09/28/17 2106 - - - - - - 5\' 2"  (1.575 m) 63.5 kg (140 lb)     Physical Exam 2125: Physical examination:  Nursing notes reviewed; Vital signs and O2 SAT reviewed;  Constitutional: Well developed, Well nourished, In no acute distress; Head:  Normocephalic, atraumatic; Eyes: EOMI, PERRL, No scleral icterus; ENMT: Mouth and pharynx normal, Mucous membranes dry; Neck: Supple, Full range of motion, No lymphadenopathy; Cardiovascular: Regular rate and rhythm, No gallop; Respiratory: Breath sounds clear & equal bilaterally, No rales, rhonchi, wheezes.  Speaking full sentences with ease, Normal respiratory effort/excursion; Chest: Nontender, Movement normal; Abdomen: Soft, Nontender, Nondistended, Normal bowel sounds; Genitourinary: No CVA tenderness; Extremities: Peripheral pulses normal, No tenderness, No edema, No calf edema or asymmetry.; Neuro: AA&Ox3, Major CN grossly intact.  Speech clear. No gross focal motor or sensory deficits in extremities.; Skin: Color normal, Warm, Dry.   ED Treatments / Results  Labs (all labs ordered are listed, but only abnormal results are displayed)   EKG EKG Interpretation  Date/Time:  Sunday September 28 2017 21:09:40 EDT Ventricular Rate:  95 PR Interval:  174 QRS Duration: 86 QT  Interval:  346 QTC Calculation: 434 R Axis:   60 Text Interpretation:  Normal sinus rhythm Possible Left atrial enlargement Cannot rule out Anterior infarct , age undetermined When compared with ECG of 05/07/2017 No significant change was found Confirmed by Francine Graven [phone]) on 09/28/2017 9:30:04 PM   Radiology   Procedures Procedures (including critical care time)  Medications Ordered in ED Medications  iopamidol (ISOVUE-370) 76 % injection 100 mL (has no administration in time range)  acetaminophen (TYLENOL) tablet 650 mg (650 mg Oral Given 09/28/17 2153)  ipratropium-albuterol (DUONEB) 0.5-2.5 (3) MG/3ML nebulizer solution 3 mL (3 mLs Nebulization Given 09/28/17 2155)  albuterol (PROVENTIL) (2.5 MG/3ML) 0.083% nebulizer solution 2.5 mg (2.5 mg Nebulization Given 09/28/17 2155)  sodium chloride 0.9 % bolus 1,000 mL (1,000 mLs Intravenous New Bag/Given 09/28/17 2154)  Initial Impression / Assessment and Plan / ED Course  I have reviewed the triage vital signs and the nursing notes.  Pertinent labs & imaging results that were available during my care of the patient were reviewed by me and considered in my medical decision making (see chart for details).  MDM Reviewed: previous chart, nursing note and vitals Reviewed previous: labs and ECG Interpretation: labs, ECG, x-ray and CT scan   Results for orders placed or performed during the hospital encounter of 09/28/17  Troponin I  Result Value Ref Range   Troponin I <0.03 <0.03 ng/mL  Comprehensive metabolic panel  Result Value Ref Range   Sodium 137 135 - 145 mmol/L   Potassium 3.4 (L) 3.5 - 5.1 mmol/L   Chloride 106 101 - 111 mmol/L   CO2 22 22 - 32 mmol/L   Glucose, Bld 139 (H) 65 - 99 mg/dL   BUN 9 6 - 20 mg/dL   Creatinine, Ser 0.72 0.44 - 1.00 mg/dL   Calcium 9.0 8.9 - 10.3 mg/dL   Total Protein 7.6 6.5 - 8.1 g/dL   Albumin 4.3 3.5 - 5.0 g/dL   AST 30 15 - 41 U/L   ALT 17 14 - 54 U/L   Alkaline Phosphatase 72  38 - 126 U/L   Total Bilirubin 0.5 0.3 - 1.2 mg/dL   GFR calc non Af Amer >60 >60 mL/min   GFR calc Af Amer >60 >60 mL/min   Anion gap 9 5 - 15  CBC with Differential  Result Value Ref Range   WBC 8.5 4.0 - 10.5 K/uL   RBC 4.22 3.87 - 5.11 MIL/uL   Hemoglobin 12.6 12.0 - 15.0 g/dL   HCT 38.6 36.0 - 46.0 %   MCV 91.5 78.0 - 100.0 fL   MCH 29.9 26.0 - 34.0 pg   MCHC 32.6 30.0 - 36.0 g/dL   RDW 13.4 11.5 - 15.5 %   Platelets 151 150 - 400 K/uL   Neutrophils Relative % 76 %   Neutro Abs 6.4 1.7 - 7.7 K/uL   Lymphocytes Relative 15 %   Lymphs Abs 1.3 0.7 - 4.0 K/uL   Monocytes Relative 6 %   Monocytes Absolute 0.5 0.1 - 1.0 K/uL   Eosinophils Relative 3 %   Eosinophils Absolute 0.3 0.0 - 0.7 K/uL   Basophils Relative 0 %   Basophils Absolute 0.0 0.0 - 0.1 K/uL  Lactic acid, plasma  Result Value Ref Range   Lactic Acid, Venous 1.2 0.5 - 1.9 mmol/L  Magnesium  Result Value Ref Range   Magnesium 1.7 1.7 - 2.4 mg/dL  D-dimer, quantitative  Result Value Ref Range   D-Dimer, Quant 0.53 (H) 0.00 - 0.50 ug/mL-FEU   Dg Chest 2 View Result Date: 09/28/2017 CLINICAL DATA:  LEFT chest pain, shortness of breath. Body cramping for 2 days. EXAM: CHEST - 2 VIEW COMPARISON:  Chest radiograph January 23rd 219 FINDINGS: Limited lateral radiograph due to rotation. Cardiomediastinal silhouette is normal. Patchy LEFT lower lobe airspace opacity. No pleural effusion. Mild interstitial prominence with increased lung volumes, flattened hemidiaphragms. Trachea projects midline and there is no pneumothorax. Soft tissue planes and included osseous structures are non-suspicious. IMPRESSION: LEFT lower lobe atelectasis or pneumonia. COPD. Electronically Signed   By: Elon Alas M.D.   On: 09/28/2017 22:50    2325:  Short neb given as well as APAP for fever. Potassium and magnesium repleted PO. D-dimer elevated; CT-A chest pending. Udip pending. Pt will need to ambulate  with O2 Sat. Dispo based on results.  Sign out to Dr. Dayna Barker.     Final Clinical Impressions(s) / ED Diagnoses   Final diagnoses:  None    ED Discharge Orders    None       Francine Graven, DO 09/28/17 2327

## 2017-09-28 NOTE — ED Triage Notes (Signed)
Pt c/o left sided chest pain and sob. Pt also c/o of cramps all over body x 2 days.

## 2017-09-29 LAB — LACTIC ACID, PLASMA: Lactic Acid, Venous: 1.5 mmol/L (ref 0.5–1.9)

## 2017-09-29 MED ORDER — DOXYCYCLINE HYCLATE 100 MG PO TABS
100.0000 mg | ORAL_TABLET | Freq: Once | ORAL | Status: AC
  Administered 2017-09-29: 100 mg via ORAL
  Filled 2017-09-29: qty 1

## 2017-09-29 MED ORDER — DOXYCYCLINE HYCLATE 100 MG PO CAPS: 100.0000 mg | ORAL_CAPSULE | Freq: Two times a day (BID) | ORAL | 0 refills | Status: DC

## 2017-09-29 MED ORDER — ACETAMINOPHEN 500 MG PO TABS
1000.0000 mg | ORAL_TABLET | Freq: Once | ORAL | Status: AC
  Administered 2017-09-29: 1000 mg via ORAL
  Filled 2017-09-29: qty 2

## 2017-09-29 MED ORDER — BENZONATATE 100 MG PO CAPS: 100.0000 mg | ORAL_CAPSULE | Freq: Three times a day (TID) | ORAL | 0 refills | Status: DC | PRN

## 2017-09-29 NOTE — ED Notes (Signed)
Pt ambulated to bathroom with steady gait Pulse ox stayed at 96% while ambulating

## 2017-09-29 NOTE — ED Provider Notes (Signed)
1:12 AM Assumed care from Dr. Thurnell Garbe, please see their note for full history, physical and decision making until this point. In brief this is a 57 y.o. year old female who presented to the ED tonight with Chest Pain and Cough     57 year old smoker here with couple days with the chest pain, cough and shortness of breath.  Pending CT scan urinalysis and reevaluation for disposition.  Thought to likely have community acquired pneumonia.  My exam patient states she still feels crummy but is not short of breath and she was.  She is in no respiratory distress her story rate is right around 20.  Her lungs have a little bit of wheezing but she states that the breathing treatment did not really seem to help her much earlier.  She ambulated without any significant tachypnea or respiratory distress with pulse ox that stayed around 96% never get below 95.  At this point patient prefers to go home and there is no absolute indication for mission.  Plan will be for doxycycline and PCP follow-up and return here for any worsening symptoms.  Discharge instructions, including strict return precautions for new or worsening symptoms, given. Patient and/or family verbalized understanding and agreement with the plan as described.   Labs, studies and imaging reviewed by myself and considered in medical decision making if ordered. Imaging interpreted by radiology.  Labs Reviewed  COMPREHENSIVE METABOLIC PANEL - Abnormal; Notable for the following components:      Result Value   Potassium 3.4 (*)    Glucose, Bld 139 (*)    All other components within normal limits  D-DIMER, QUANTITATIVE (NOT AT [facility]) - Abnormal; Notable for the following components:   D-Dimer, Quant 0.53 (*)    All other components within normal limits  URINE CULTURE  TROPONIN I  URINALYSIS, ROUTINE W REFLEX MICROSCOPIC  CBC WITH DIFFERENTIAL/PLATELET  LACTIC ACID, PLASMA  LACTIC ACID, PLASMA  MAGNESIUM    CT Angio Chest PE W/Cm &/Or Wo Cm   Final Result    DG Chest 2 View  Final Result      No follow-ups on file.    Merrily Pew, MD 09/29/17 [phone]

## 2017-09-30 LAB — URINE CULTURE: Culture: NO GROWTH

## 2018-04-08 IMAGING — CR DG CHEST 1V PORT
1 series · 1 of 1 positions shown · non-contrast
Comparison: 01/27/2015

CLINICAL DATA: Dyspnea and cough tonight.  Lightheadedness.

EXAM:
PORTABLE CHEST 1 VIEW

[ap portable]
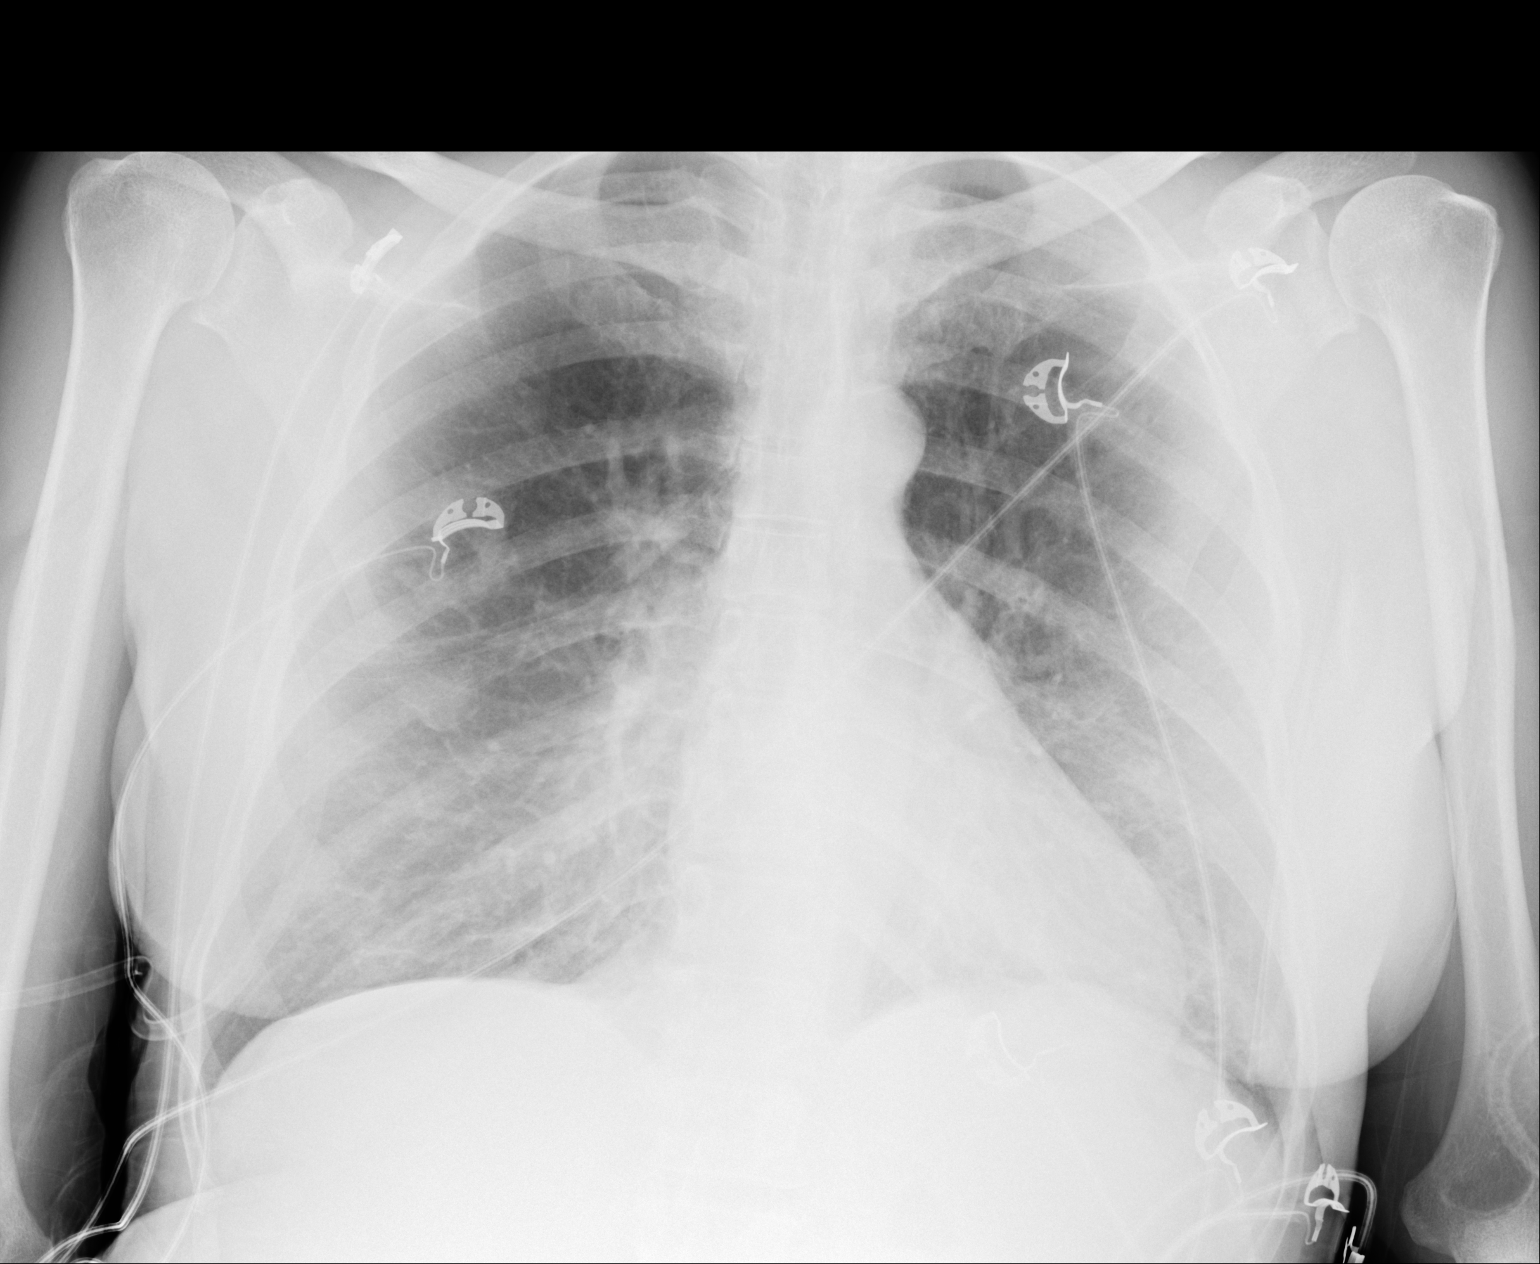

[1 of 1 positions shown; findings below may reference images not displayed]

FINDINGS: No airspace consolidation. No effusions. Mild vascular prominence.
Normal heart size, unchanged. Hilar and mediastinal contours are
unremarkable and unchanged
IMPRESSION: Mild vascular prominence. No interstitial or alveolar edema. No
consolidation.

## 2018-04-21 IMAGING — CR DG ABDOMEN ACUTE W/ 1V CHEST
3 series · 3 of 3 positions shown · non-contrast
Comparison: 06/27/2016

CLINICAL DATA: No bowel movements abdominal pain and vomiting

EXAM:
DG ABDOMEN ACUTE W/ 1V CHEST

[chest pa]
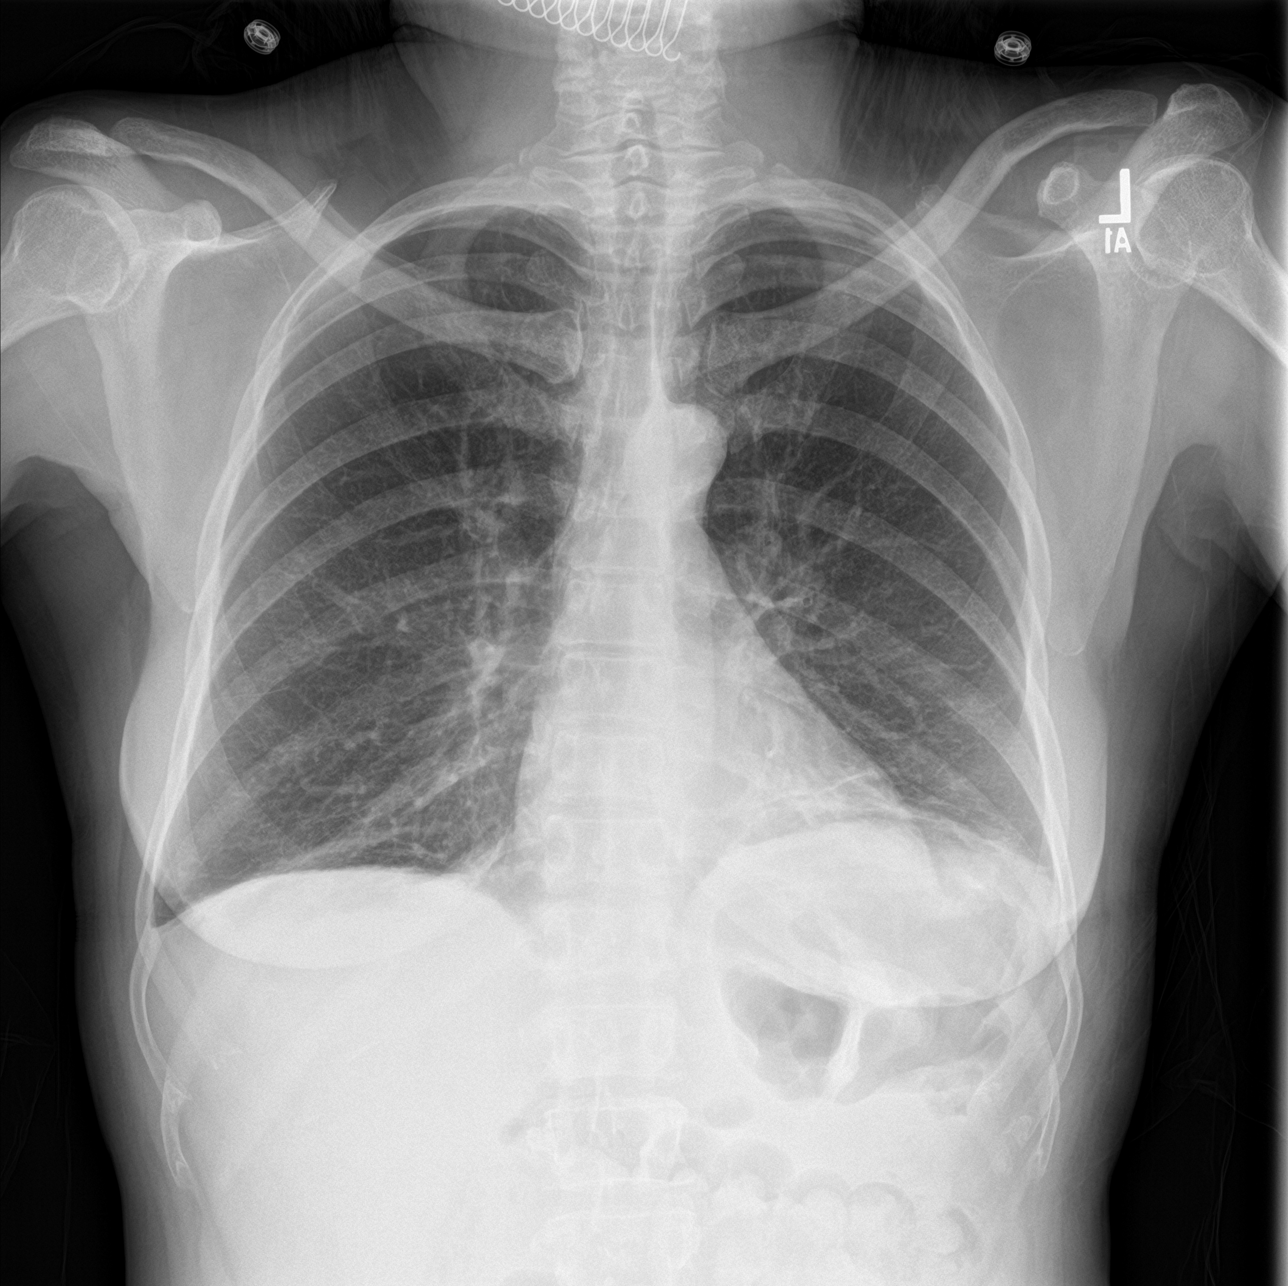

[abdomen erect]
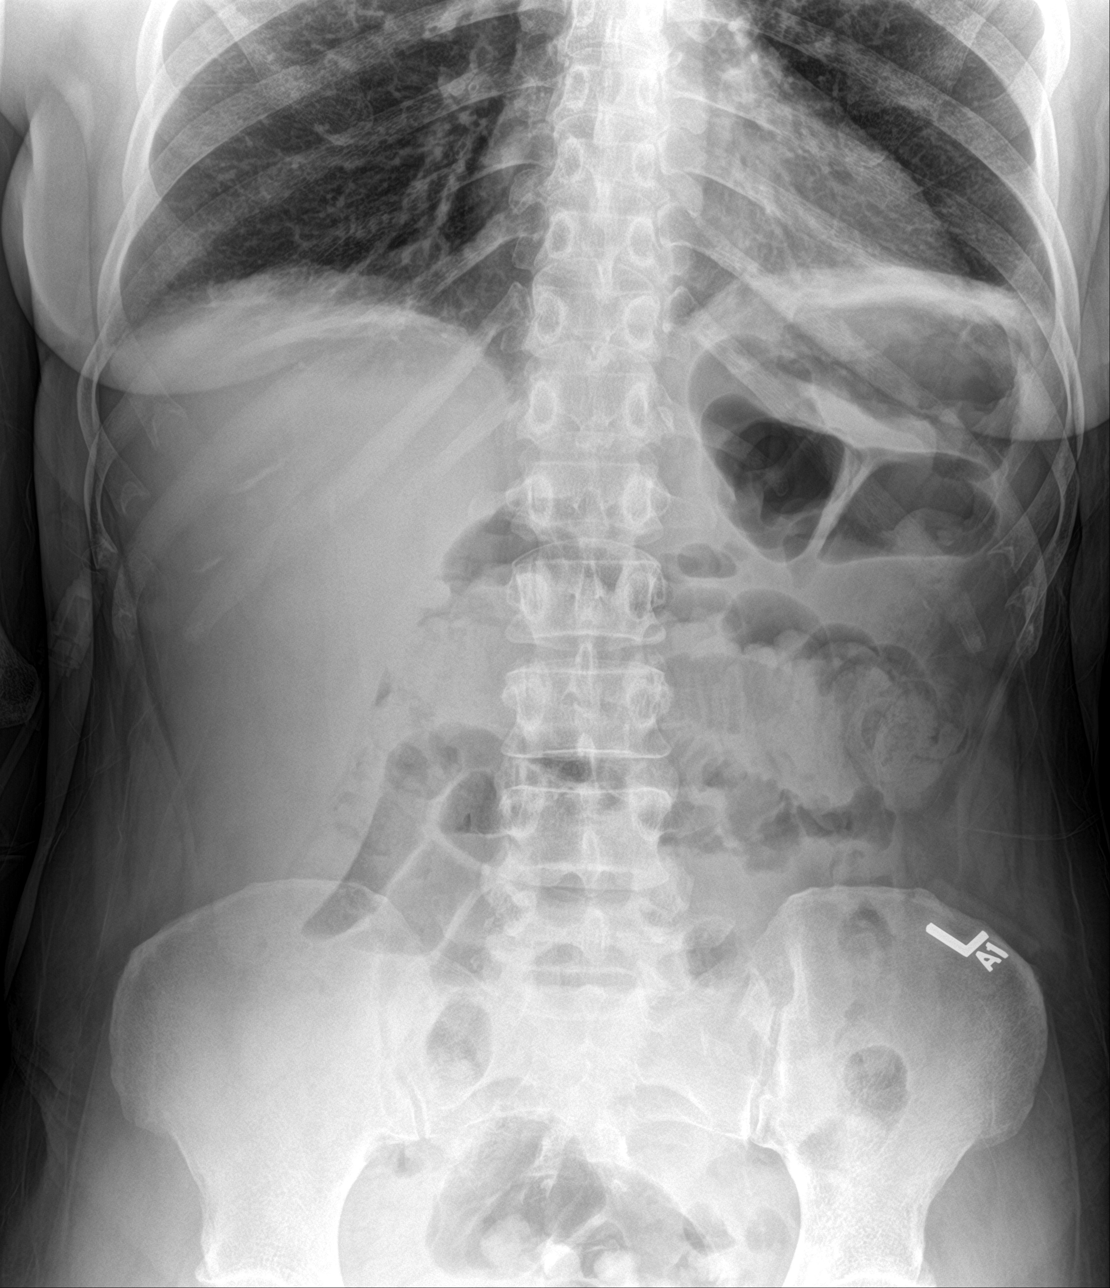

[abdomen supine]
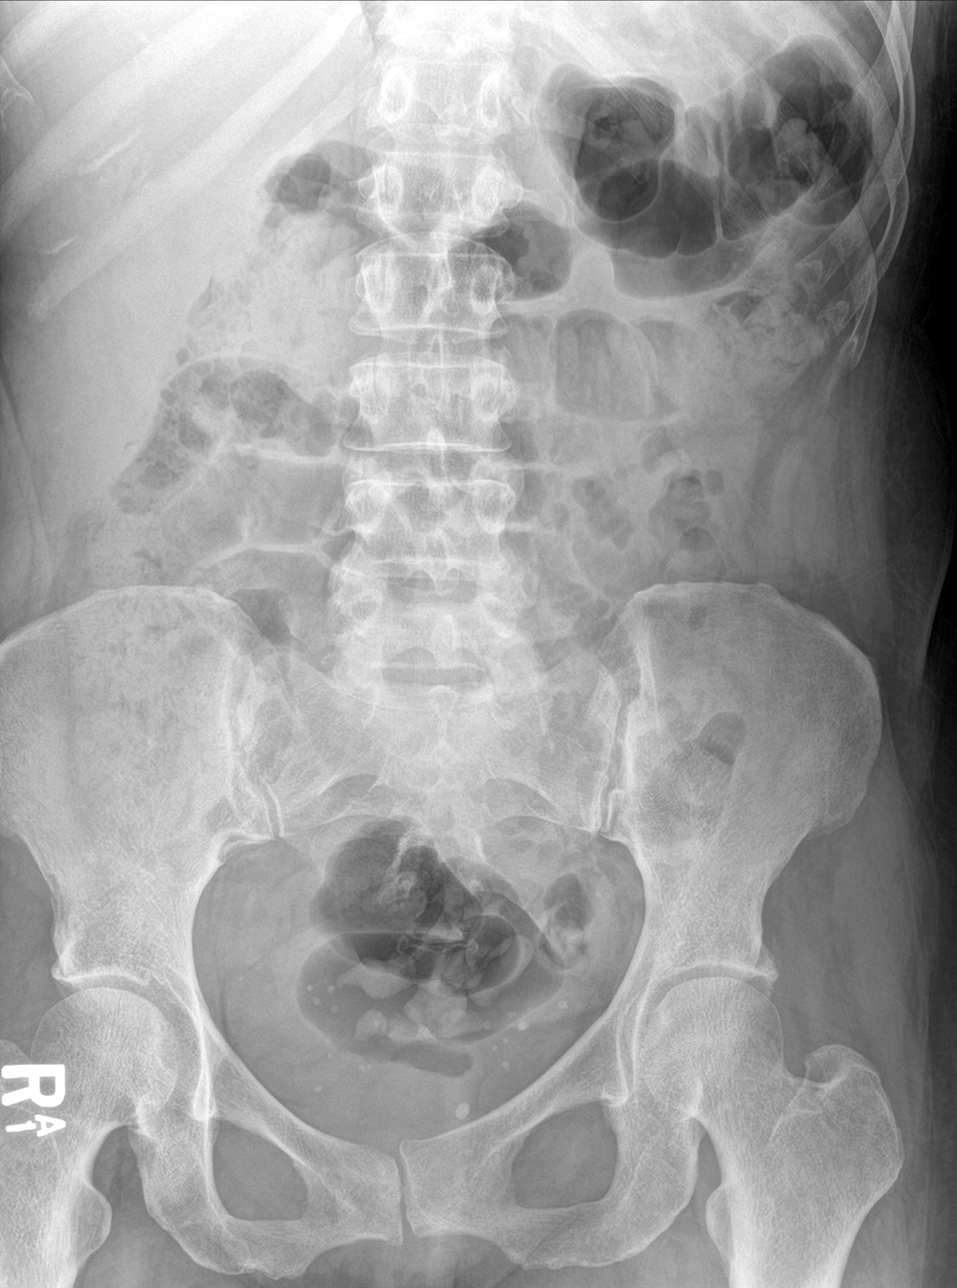

[3 of 3 positions shown; findings below may reference images not displayed]

FINDINGS: Single-view chest demonstrates subsegmental atelectasis at the lung
bases. Normal heart size.

Supine and upright views of the abdomen demonstrate no free air
beneath the diaphragm. Overall nonobstructed gas pattern with gas
present in the colon and rectum. Moderate stool. Calcified pelvic
phleboliths. No pathologic calcifications.
IMPRESSION: 1. Nonobstructed gas pattern
2. Subsegmental atelectasis at the lung bases.

## 2018-07-27 ENCOUNTER — Emergency Department (HOSPITAL_COMMUNITY): Payer: Medicaid Other

## 2018-07-27 ENCOUNTER — Emergency Department (HOSPITAL_COMMUNITY)
Admission: EM | Admit: 2018-07-27 | Discharge: 2018-07-27 | Disposition: A | Discharge status: Home / Self Care | Payer: Medicaid Other | Attending: Emergency Medicine | Admitting: Emergency Medicine

## 2018-07-27 ENCOUNTER — Other Ambulatory Visit: Payer: Self-pay

## 2018-07-27 DIAGNOSIS — J45909 Unspecified asthma, uncomplicated: Secondary | ICD-10-CM | POA: Insufficient documentation

## 2018-07-27 DIAGNOSIS — S91141A Puncture wound with foreign body of right great toe without damage to nail, initial encounter: Secondary | ICD-10-CM | POA: Insufficient documentation

## 2018-07-27 DIAGNOSIS — S90451A Superficial foreign body, right great toe, initial encounter: Secondary | ICD-10-CM

## 2018-07-27 DIAGNOSIS — Y999 Unspecified external cause status: Secondary | ICD-10-CM | POA: Insufficient documentation

## 2018-07-27 DIAGNOSIS — Z23 Encounter for immunization: Secondary | ICD-10-CM | POA: Diagnosis not present

## 2018-07-27 DIAGNOSIS — Y929 Unspecified place or not applicable: Secondary | ICD-10-CM | POA: Diagnosis not present

## 2018-07-27 DIAGNOSIS — Y939 Activity, unspecified: Secondary | ICD-10-CM | POA: Diagnosis not present

## 2018-07-27 DIAGNOSIS — Z79899 Other long term (current) drug therapy: Secondary | ICD-10-CM | POA: Diagnosis not present

## 2018-07-27 DIAGNOSIS — W458XXA Other foreign body or object entering through skin, initial encounter: Secondary | ICD-10-CM | POA: Diagnosis not present

## 2018-07-27 DIAGNOSIS — F17210 Nicotine dependence, cigarettes, uncomplicated: Secondary | ICD-10-CM | POA: Diagnosis not present

## 2018-07-27 DIAGNOSIS — S99921A Unspecified injury of right foot, initial encounter: Secondary | ICD-10-CM | POA: Diagnosis present

## 2018-07-27 MED ORDER — TRAMADOL HCL 50 MG PO TABS: 50.0000 mg | ORAL_TABLET | Freq: Four times a day (QID) | ORAL | 0 refills | Status: DC | PRN

## 2018-07-27 MED ORDER — ONDANSETRON HCL 4 MG PO TABS
4.0000 mg | ORAL_TABLET | Freq: Once | ORAL | Status: AC
  Administered 2018-07-27: 4 mg via ORAL
  Filled 2018-07-27: qty 1

## 2018-07-27 MED ORDER — LIDOCAINE HCL (PF) 2 % IJ SOLN
10.0000 mL | Freq: Once | INTRAMUSCULAR | Status: AC
  Administered 2018-07-27: 15:00:00 10 mL

## 2018-07-27 MED ORDER — CLINDAMYCIN HCL 150 MG PO CAPS
300.0000 mg | ORAL_CAPSULE | Freq: Once | ORAL | Status: AC
  Administered 2018-07-27: 300 mg via ORAL
  Filled 2018-07-27: qty 2

## 2018-07-27 MED ORDER — TETANUS-DIPHTH-ACELL PERTUSSIS 5-2.5-18.5 LF-MCG/0.5 IM SUSP
0.5000 mL | Freq: Once | INTRAMUSCULAR | Status: AC
  Administered 2018-07-27: 0.5 mL via INTRAMUSCULAR
  Filled 2018-07-27: qty 0.5

## 2018-07-27 MED ORDER — CLINDAMYCIN HCL 150 MG PO CAPS: ORAL_CAPSULE | ORAL | 0 refills | Status: DC

## 2018-07-27 MED ORDER — TRAMADOL HCL 50 MG PO TABS
100.0000 mg | ORAL_TABLET | Freq: Once | ORAL | Status: AC
  Administered 2018-07-27: 100 mg via ORAL
  Filled 2018-07-27: qty 2

## 2018-07-27 MED ORDER — LIDOCAINE HCL (PF) 2 % IJ SOLN
INTRAMUSCULAR | Status: AC
  Administered 2018-07-27: 15:00:00 10 mL
  Filled 2018-07-27: qty 20

## 2018-07-27 NOTE — ED Provider Notes (Signed)
[facility] EMERGENCY DEPARTMENT Provider Note   CSN: [id number] Arrival date & time: 07/27/18  1331    History   Chief Complaint Chief Complaint  Patient presents with  . foreign body in toe    HPI Alexis Lucas is a 58 y.o. female.     Patient is a 58 year old female who presents to the emergency department with a complaint of foreign body/hook in the right great toe.  The patient states that today prior to her admission to the emergency department she had taken off her shoes to go change clothes when she walked on a carpeted floor and felt a pain in her great toe.  She also noticed that her toe was stuck to the carpet.  On close investigation she had stepped on a fishing hook and the patient hook was tangled in the carpet but had also penetrated the skin of her toe.  She and her husband attempted to remove it, but were unsuccessful.  She presents to the emergency department for evaluation and management.  The patient states she is not sure of the date of her last tetanus.  The history is provided by the patient.    Past Medical History:  Diagnosis Date  . Asthma   . Cervical radiculopathy   . Chronic headaches   . Neck pain   . Ovarian tumor     There are no active problems to display for this patient.   Past Surgical History:  Procedure Laterality Date  . ABDOMINAL HYSTERECTOMY    . LEFT OOPHORECTOMY       OB History   No obstetric history on file.      Home Medications    Prior to Admission medications   Medication Sig Start Date End Date Taking? Authorizing Provider  albuterol (PROVENTIL HFA;VENTOLIN HFA) 108 (90 BASE) MCG/ACT inhaler Inhale 2 puffs into the lungs every 6 (six) hours as needed for wheezing or shortness of breath.    Yes [provider]  albuterol (PROVENTIL) (2.5 MG/3ML) 0.083% nebulizer solution Take 2.5 mg by nebulization every 6 (six) hours as needed for wheezing or shortness of breath.   Yes [provider]   ibuprofen (ADVIL,MOTRIN) 800 MG tablet Take 800 mg by mouth every 8 (eight) hours as needed for mild pain or moderate pain.   Yes [provider]  lactulose (CHRONULAC) 10 GM/15ML solution Take 30 mLs (20 g total) by mouth daily as needed for moderate constipation. 07/01/16  Yes Isla Pence, MD    Family History No family history on file.  Social History Social History   Tobacco Use  . Smoking status: Current Every Day Smoker    Packs/day: 1.00    Types: Cigarettes  . Smokeless tobacco: Never Used  Substance Use Topics  . Alcohol use: No  . Drug use: Yes    Types: Marijuana    Comment: occasional     Allergies   Penicillins and Shellfish allergy   Review of Systems Review of Systems  Constitutional: Negative for activity change.       All ROS Neg except as noted in HPI  HENT: Negative for nosebleeds.   Eyes: Negative for photophobia and discharge.  Respiratory: Negative for cough, shortness of breath and wheezing.   Cardiovascular: Negative for chest pain and palpitations.  Gastrointestinal: Negative for abdominal pain and blood in stool.  Genitourinary: Negative for dysuria, frequency and hematuria.  Musculoskeletal: Negative for arthralgias, back pain and neck pain.  Toe pain  Skin: Negative.   Neurological: Negative for dizziness, seizures and speech difficulty.  Psychiatric/Behavioral: Negative for confusion and hallucinations.     Physical Exam Updated Vital Signs BP 129/76 (BP Location: Right Arm)   Pulse 80   Temp 98.1 F (36.7 C) (Oral)   Resp 14   Ht 5\' 2"  (1.575 m)   Wt 64.4 kg   SpO2 100%   BMI 25.97 kg/m   Physical Exam Vitals signs and nursing note reviewed.  Constitutional:      Appearance: She is well-developed. She is not toxic-appearing.  HENT:     Head: Normocephalic.     Right Ear: Tympanic membrane and external ear normal.     Left Ear: Tympanic membrane and external ear normal.  Eyes:     General: Lids are  normal.     Pupils: Pupils are equal, round, and reactive to light.  Neck:     Musculoskeletal: Normal range of motion and neck supple.     Vascular: No carotid bruit.  Cardiovascular:     Rate and Rhythm: Normal rate and regular rhythm.     Pulses: Normal pulses.     Heart sounds: Normal heart sounds.  Pulmonary:     Effort: No respiratory distress.     Breath sounds: Normal breath sounds.  Abdominal:     General: Bowel sounds are normal.     Palpations: Abdomen is soft.     Tenderness: There is no abdominal tenderness. There is no guarding.  Musculoskeletal: Normal range of motion.     Comments: Patient has a foreign body/fishhook in the medial plantar portion of the right big toe.  There is no tendon involvement.  Capillary refill is less than 2 seconds.  Dorsalis pedis pulses 2+.  Lymphadenopathy:     Head:     Right side of head: No submandibular adenopathy.     Left side of head: No submandibular adenopathy.     Cervical: No cervical adenopathy.  Skin:    General: Skin is warm and dry.  Neurological:     Mental Status: She is alert and oriented to person, place, and time.     Cranial Nerves: No cranial nerve deficit.     Sensory: No sensory deficit.  Psychiatric:        Speech: Speech normal.      ED Treatments / Results  Labs (all labs ordered are listed, but only abnormal results are displayed) Labs Reviewed - No data to display  EKG None  Radiology Dg Toe Great Right  Result Date: 07/27/2018 CLINICAL DATA:  Foreign body impaled in the right great toe. EXAM: RIGHT GREAT TOE COMPARISON:  None. FINDINGS: There is a fish hook in the soft tissues of the plantar medial aspect of the distal great toe. No acute bone abnormality. IMPRESSION: Fishhook in the soft tissues of the great toe. Electronically Signed   By: Lorriane Shire M.D.   On: 07/27/2018 14:12    Procedures .Foreign Body Removal Date/Time: 07/27/2018 3:40 PM Performed by: Lily Kocher, PA-C  Authorized by: Lily Kocher, PA-C  Consent: Verbal consent obtained. Risks and benefits: risks, benefits and alternatives were discussed Consent given by: patient Patient understanding: patient states understanding of the procedure being performed Imaging studies: imaging studies available Required items: required blood products, implants, devices, and special equipment available Patient identity confirmed: arm band Time out: Immediately prior to procedure a "time out" was called to verify the correct patient, procedure, equipment, support staff and  site/side marked as required. Body area: skin General location: lower extremity Location details: right big toe Anesthesia: local infiltration  Anesthesia: Local Anesthetic: lidocaine 2% without epinephrine  Sedation: Patient sedated: no  Patient restrained: no Patient cooperative: yes Localization method: visualized Removal mechanism: forceps and hemostat Dressing: dressing applied Tendon involvement: none Depth: deep Complexity: simple 1 objects recovered. Objects recovered: fishing hook. Post-procedure assessment: foreign body removed Patient tolerance: Patient tolerated the procedure well with no immediate complications   (including critical care time)  Medications Ordered in ED Medications  lidocaine (XYLOCAINE) 2 % injection 10 mL (10 mLs Other Given by Other 07/27/18 1447)  Tdap (BOOSTRIX) injection 0.5 mL (0.5 mLs Intramuscular Given 07/27/18 1514)  clindamycin (CLEOCIN) capsule 300 mg (300 mg Oral Given 07/27/18 1512)  traMADol (ULTRAM) tablet 100 mg (100 mg Oral Given 07/27/18 1513)  ondansetron (ZOFRAN) tablet 4 mg (4 mg Oral Given 07/27/18 1513)     Initial Impression / Assessment and Plan / ED Course  I have reviewed the triage vital signs and the nursing notes.  Pertinent labs & imaging results that were available during my care of the patient were reviewed by me and considered in my medical decision making (see  chart for details).          Final Clinical Impressions(s) / ED Diagnoses MDM  Vital signs reviewed.  Patient presented to the emergency department with a foreign body/fishhook in the big toe.  This was removed without problem.  Patient placed on antibiotics, and tetanus status was updated.  Patient is advised to see the primary physician or return to the emergency department if any signs of advancing infection, problems, or concerns.   Final diagnoses:  Foreign body of skin of great toe, right, initial encounter    ED Discharge Orders    None       Lily Kocher, PA-C 07/27/18 1544    Maudie Flakes, MD 07/28/18 1538

## 2018-07-27 NOTE — Discharge Instructions (Addendum)
The hook was successfully removed from your toe.  Please cleanse the area daily with soap and water, and apply bandage until healed.  Please use 2 tablets of clindamycin 2 times daily until all taken.  Use Tylenol or ibuprofen for mild pain, use Ultram for more severe pain. This medication may cause drowsiness. Please do not drink, drive, or participate in activity that requires concentration while taking this medication.  Please see your primary physician or return to the emergency department if any signs of advancing infection.

## 2018-07-27 NOTE — ED Triage Notes (Signed)
Pt states that she has a fishing hook in her right great toe

## 2019-04-06 ENCOUNTER — Ambulatory Visit: Payer: Medicaid Other | Attending: Internal Medicine

## 2019-04-06 DIAGNOSIS — Z20822 Contact with and (suspected) exposure to covid-19: Secondary | ICD-10-CM

## 2019-04-08 LAB — NOVEL CORONAVIRUS, NAA: SARS-CoV-2, NAA: NOT DETECTED

## 2019-05-04 ENCOUNTER — Ambulatory Visit: Payer: Medicaid Other | Attending: Internal Medicine

## 2019-05-04 ENCOUNTER — Other Ambulatory Visit: Payer: Self-pay

## 2019-05-04 DIAGNOSIS — Z20822 Contact with and (suspected) exposure to covid-19: Secondary | ICD-10-CM

## 2019-05-05 LAB — NOVEL CORONAVIRUS, NAA: SARS-CoV-2, NAA: NOT DETECTED

## 2019-07-19 IMAGING — DX DG CHEST 2V
2 series · 2 of 2 positions shown · non-contrast
Comparison: Chest radiograph [DATE][REDACTED] 219

CLINICAL DATA: LEFT chest pain, shortness of breath. Body cramping
for 2 days.

EXAM:
CHEST - 2 VIEW

[chest lat]
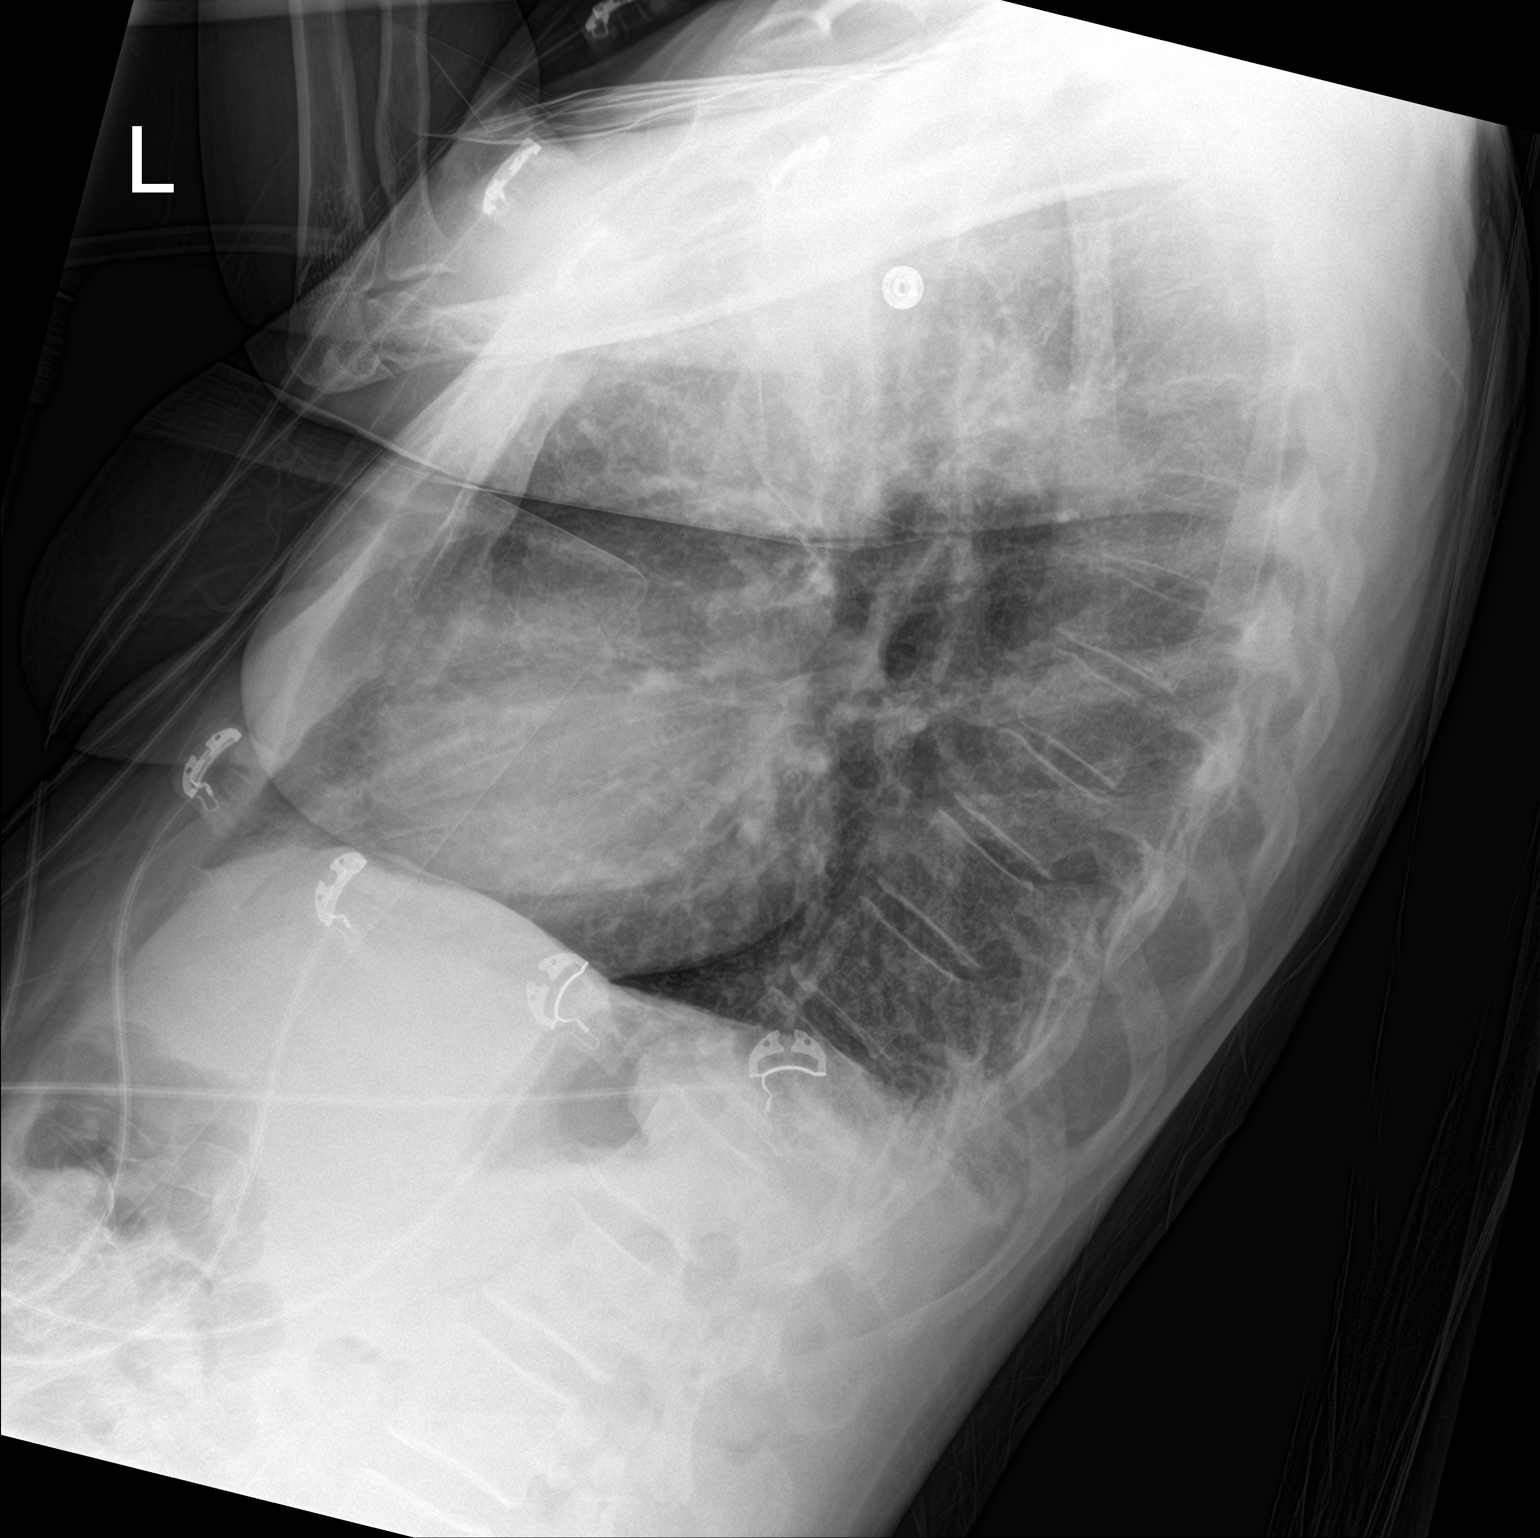

[chest ap]
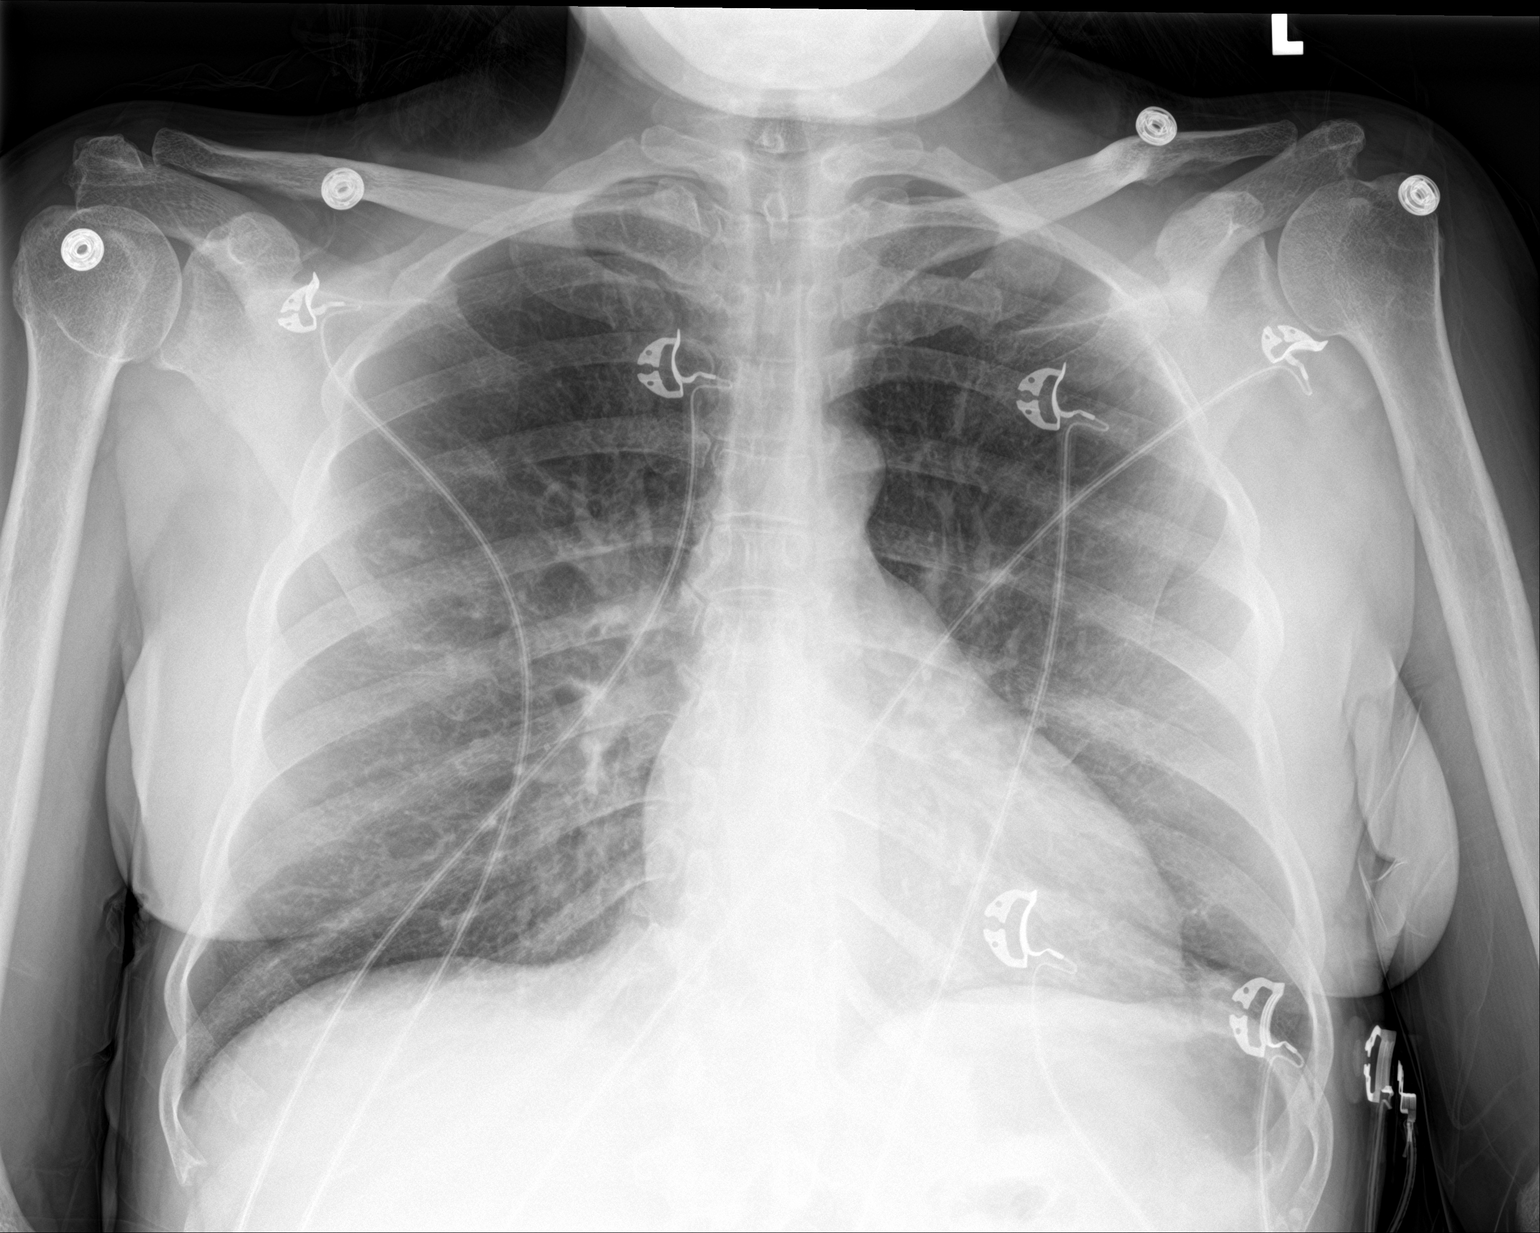

[2 of 2 positions shown; findings below may reference images not displayed]

FINDINGS: Limited lateral radiograph due to rotation. Cardiomediastinal
silhouette is normal. Patchy LEFT lower lobe airspace opacity. No
pleural effusion. Mild interstitial prominence with increased lung
volumes, flattened hemidiaphragms. Trachea projects midline and
there is no pneumothorax. Soft tissue planes and included osseous
structures are non-suspicious.
IMPRESSION: LEFT lower lobe atelectasis or pneumonia.

COPD.

## 2021-04-02 ENCOUNTER — Other Ambulatory Visit (HOSPITAL_COMMUNITY): Payer: Self-pay | Admitting: Family Medicine

## 2021-04-02 DIAGNOSIS — F17209 Nicotine dependence, unspecified, with unspecified nicotine-induced disorders: Secondary | ICD-10-CM

## 2021-04-10 ENCOUNTER — Encounter: Payer: Self-pay | Admitting: *Deleted

## 2021-05-11 ENCOUNTER — Other Ambulatory Visit: Payer: Self-pay

## 2021-05-11 ENCOUNTER — Ambulatory Visit (HOSPITAL_COMMUNITY)
Admission: RE | Admit: 2021-05-11 | Discharge: 2021-05-11 | Disposition: A | Discharge status: Home / Self Care | Payer: Medicaid Other | Source: Ambulatory Visit | Attending: Family Medicine | Admitting: Family Medicine

## 2021-05-11 DIAGNOSIS — F17209 Nicotine dependence, unspecified, with unspecified nicotine-induced disorders: Secondary | ICD-10-CM | POA: Insufficient documentation

## 2021-05-29 ENCOUNTER — Ambulatory Visit (INDEPENDENT_AMBULATORY_CARE_PROVIDER_SITE_OTHER): Payer: Self-pay | Admitting: *Deleted

## 2021-05-29 ENCOUNTER — Other Ambulatory Visit: Payer: Self-pay

## 2021-05-29 VITALS — Ht 62.0 in | Wt 125.0 lb

## 2021-05-29 DIAGNOSIS — Z1211 Encounter for screening for malignant neoplasm of colon: Secondary | ICD-10-CM

## 2021-05-29 NOTE — Progress Notes (Signed)
Pt informed me that she has [employer]BS but ran it in system and it is not showing active.  BCBS: [id number]

## 2021-05-29 NOTE — Progress Notes (Signed)
Gastroenterology Pre-Procedure Review  Request Date: 05/29/2021 Requesting Physician: Dr. Berdine Addison, no previous TCS, hx of polyps (father)  PATIENT REVIEW QUESTIONS: The patient responded to the following health history questions as indicated:    1. Diabetes Melitis: no 2. Joint replacements in the past 12 months: no 3. Major health problems in the past 3 months: yes, breast cancer Dec 2022, had surgery May 16, 2021 4. Has an artificial valve or MVP: no 5. Has a defibrillator: no 6. Has been advised in past to take antibiotics in advance of a procedure like teeth cleaning: no 7. Family history of colon cancer: no 8. Alcohol Use: no 9. Illicit drug Use: yes, marijuana x 3 times a week 10. History of sleep apnea: no 11. History of coronary artery or other vascular stents placed within the last 12 months: no 12. History of any prior anesthesia complications: no 13. Body mass index is 22.86 kg/m.    MEDICATIONS & ALLERGIES:    Patient reports the following regarding taking any blood thinners:   Plavix? no Aspirin? no Coumadin? no Brilinta? no Xarelto? no Eliquis? no Pradaxa? no Savaysa? no Effient? no  Patient confirms/reports the following medications:  Current Outpatient Medications  Medication Sig Dispense Refill   albuterol (PROVENTIL HFA;VENTOLIN HFA) 108 (90 BASE) MCG/ACT inhaler Inhale 2 puffs into the lungs as needed for wheezing or shortness of breath.     albuterol (PROVENTIL) (2.5 MG/3ML) 0.083% nebulizer solution Take 2.5 mg by nebulization as needed for wheezing or shortness of breath.     buPROPion (WELLBUTRIN SR) 150 MG 12 hr tablet Take by mouth daily at 6 (six) AM.     ibuprofen (ADVIL,MOTRIN) 800 MG tablet Take 800 mg by mouth as needed for mild pain or moderate pain.     letrozole (FEMARA) 2.5 MG tablet Take 2.5 mg by mouth daily.     No current facility-administered medications for this visit.    Patient confirms/reports the following allergies:  Allergies   Allergen Reactions   Penicillins Hives, Shortness Of Breath, Swelling and Other (See Comments)    Hair Loss Has patient had a PCN reaction causing immediate rash, facial/tongue/throat swelling, SOB or lightheadedness with hypotension: Yes Has patient had a PCN reaction causing severe rash involving mucus membranes or skin necrosis: No Has patient had a PCN reaction that required hospitalization No (pt visited emergency room for treatment) Has patient had a PCN reaction occurring within the last 10 years: No If all of the above answers are "NO", then may proceed with Cephalosporin use.   Shellfish Allergy     No orders of the defined types were placed in this encounter.   AUTHORIZATION INFORMATION Primary Insurance: Medicaid,  ID #: [id number] O Pre-Cert / Josem Kaufmann required: No, not required  SCHEDULE INFORMATION: Procedure has been scheduled as follows:  Date: , Time:   Location: [facility] with Dr. Abbey Chatters  This Gastroenterology Pre-Precedure Review Form is being routed to the following provider(s): Neil Crouch, PA-C

## 2021-05-30 NOTE — Progress Notes (Signed)
With recent breast cancer surgery/diagnosis, we need to hold off of colonoscopy until she is released by her surgeon and can we find out if she will be having radiation or chemo.

## 2021-05-31 NOTE — Progress Notes (Signed)
Spoke to pt.  Informed her that with recent breast cancer surgery/diagnosis, we need to hold off on colonoscopy until she is released by her surgeon.  Pt thinks she is going to have radiation.  Has appointment for 06/08/2021.

## 2021-09-24 ENCOUNTER — Telehealth: Payer: Self-pay | Admitting: Internal Medicine

## 2021-09-24 NOTE — Telephone Encounter (Signed)
Pt LMOM that she had been triaged the earlier part of the year and had to go through radiation. She is now wanting to schedule her colonoscopy. Will she need a questionnaire form or can she be scheduled? [phone]

## 2021-09-24 NOTE — Telephone Encounter (Signed)
Per last triage note "With recent breast cancer surgery/diagnosis, we need to hold off of colonoscopy until she is released by her surgeon and can we find out if she will be having radiation or chemo."  If she has been released, get new referral sent over and then questionnaire can be sent out since it has been several months

## 2021-09-26 NOTE — Telephone Encounter (Signed)
I left message on VM that we needed clearance or something from her surgeon that she's been released to have procedure done and we would also need an updated referral from her PCP. Once we get those we will mail her a questionnaire form and move forward from there in getting her scheduled.

## 2021-12-19 ENCOUNTER — Encounter: Payer: Self-pay | Admitting: Emergency Medicine

## 2021-12-19 ENCOUNTER — Ambulatory Visit
Admission: EM | Admit: 2021-12-19 | Discharge: 2021-12-19 | Disposition: A | Discharge status: Home / Self Care | Payer: BC Managed Care – PPO | Attending: Family Medicine | Admitting: Family Medicine

## 2021-12-19 ENCOUNTER — Other Ambulatory Visit: Payer: Self-pay

## 2021-12-19 DIAGNOSIS — J4541 Moderate persistent asthma with (acute) exacerbation: Secondary | ICD-10-CM | POA: Diagnosis not present

## 2021-12-19 HISTORY — DX: Malignant neoplasm of unspecified site of unspecified female breast: C50.919

## 2021-12-19 MED ORDER — ALBUTEROL SULFATE (2.5 MG/3ML) 0.083% IN NEBU: 2.5000 mg | INHALATION_SOLUTION | Freq: Four times a day (QID) | RESPIRATORY_TRACT | 0 refills | Status: AC | PRN

## 2021-12-19 MED ORDER — PREDNISONE 20 MG PO TABS: 40.0000 mg | ORAL_TABLET | Freq: Every day | ORAL | 0 refills | Status: DC

## 2021-12-19 MED ORDER — ALBUTEROL SULFATE HFA 108 (90 BASE) MCG/ACT IN AERS: 2.0000 | INHALATION_SPRAY | RESPIRATORY_TRACT | 0 refills | Status: DC | PRN

## 2021-12-19 NOTE — ED Provider Notes (Signed)
[facility]    CSN: [id number] Arrival date & time: 12/19/21  1409      History   Chief Complaint Chief Complaint  Patient presents with   Asthma    HPI Alexis Lucas is a 61 y.o. female.   Presenting today with 3-day history of asthma exacerbation.  States she has had chest tightness, worsening wheezing, cough.  Denies congestion, sore throat, fever, chills, abdominal pain, nausea vomiting diarrhea, sick contacts.  Is out of her nebulizer solution and almost out of her albuterol inhaler, still has Advair which she takes consistently.  States she always has an asthma flareup this time of year.    Past Medical History:  Diagnosis Date   Asthma    Breast cancer ([place])    Cervical radiculopathy    Chronic headaches    Neck pain    Ovarian tumor     There are no problems to display for this patient.   Past Surgical History:  Procedure Laterality Date   ABDOMINAL HYSTERECTOMY     LEFT OOPHORECTOMY      OB History   No obstetric history on file.      Home Medications    Prior to Admission medications   Medication Sig Start Date End Date Taking? Authorizing Provider  albuterol (PROVENTIL) (2.5 MG/3ML) 0.083% nebulizer solution Take 3 mLs (2.5 mg total) by nebulization every 6 (six) hours as needed for wheezing or shortness of breath. 12/19/21  Yes Volney American, PA-C  predniSONE (DELTASONE) 20 MG tablet Take 2 tablets (40 mg total) by mouth daily with breakfast. 12/19/21  Yes Volney American, PA-C  albuterol (PROVENTIL) (2.5 MG/3ML) 0.083% nebulizer solution Take 2.5 mg by nebulization as needed for wheezing or shortness of breath.    [provider]  albuterol (VENTOLIN HFA) 108 (90 Base) MCG/ACT inhaler Inhale 2 puffs into the lungs as needed for wheezing or shortness of breath. 12/19/21   Volney American, PA-C  buPROPion (WELLBUTRIN SR) 150 MG 12 hr tablet Take by mouth daily at 6 (six) AM.    [provider]  ibuprofen  (ADVIL,MOTRIN) 800 MG tablet Take 800 mg by mouth as needed for mild pain or moderate pain.    [provider]  letrozole (FEMARA) 2.5 MG tablet Take 2.5 mg by mouth daily. 05/11/21   [provider]    Family History History reviewed. No pertinent family history.  Social History Social History   Tobacco Use   Smoking status: Every Day    Packs/day: 0.50    Types: Cigarettes   Smokeless tobacco: Never  Substance Use Topics   Alcohol use: No   Drug use: Yes    Types: Marijuana    Comment: occasional     Allergies   Penicillins and Shellfish allergy   Review of Systems Review of Systems Per HPI  Physical Exam Triage Vital Signs ED Triage Vitals [12/19/21 1653]  Enc Vitals Group     BP 124/76     Pulse Rate 75     Resp 20     Temp 98.4 F (36.9 C)     Temp Source Oral     SpO2 100 %     Weight      Height      Head Circumference      Peak Flow      Pain Score 0     Pain Loc      Pain Edu?      Excl.  in [place]?    No data found.  Updated Vital Signs BP 124/76 (BP Location: Right Arm)   Pulse 75   Temp 98.4 F (36.9 C) (Oral)   Resp 20   SpO2 100%   Visual Acuity Right Eye Distance:   Left Eye Distance:   Bilateral Distance:    Right Eye Near:   Left Eye Near:    Bilateral Near:     Physical Exam Vitals and nursing note reviewed.  Constitutional:      Appearance: Normal appearance. She is not ill-appearing.  HENT:     Head: Atraumatic.     Nose: Nose normal.     Mouth/Throat:     Mouth: Mucous membranes are moist.     Pharynx: Oropharynx is clear. No oropharyngeal exudate or posterior oropharyngeal erythema.  Eyes:     Extraocular Movements: Extraocular movements intact.     Conjunctiva/sclera: Conjunctivae normal.  Cardiovascular:     Rate and Rhythm: Normal rate and regular rhythm.     Heart sounds: Normal heart sounds.  Pulmonary:     Effort: Pulmonary effort is normal.     Breath sounds: Wheezing present. No rales.      Comments: Trace wheezes diffusely Musculoskeletal:        General: Normal range of motion.     Cervical back: Normal range of motion and neck supple.  Skin:    General: Skin is warm and dry.  Neurological:     Mental Status: She is alert and oriented to person, place, and time.  Psychiatric:        Mood and Affect: Mood normal.        Thought Content: Thought content normal.        Judgment: Judgment normal.      UC Treatments / Results  Labs (all labs ordered are listed, but only abnormal results are displayed) Labs Reviewed - No data to display  EKG   Radiology No results found.  Procedures Procedures (including critical care time)  Medications Ordered in UC Medications - No data to display  Initial Impression / Assessment and Plan / UC Course  I have reviewed the triage vital signs and the nursing notes.  Pertinent labs & imaging results that were available during my care of the patient were reviewed by me and considered in my medical decision making (see chart for details).     We will refill albuterol inhaler and nebulizer solution, add a course of prednisone.  Return for worsening symptoms.  She declines viral testing today.  Final Clinical Impressions(s) / UC Diagnoses   Final diagnoses:  Moderate persistent asthma with acute exacerbation   Discharge Instructions   None    ED Prescriptions     Medication Sig Dispense Auth. Provider   albuterol (VENTOLIN HFA) 108 (90 Base) MCG/ACT inhaler Inhale 2 puffs into the lungs as needed for wheezing or shortness of breath. [place], [place]   albuterol (PROVENTIL) (2.5 MG/3ML) 0.083% nebulizer solution Take 3 mLs (2.5 mg total) by nebulization every 6 (six) hours as needed for wheezing or shortness of breath. 75 mL Volney American, PA-C   predniSONE (DELTASONE) 20 MG tablet Take 2 tablets (40 mg total) by mouth daily with breakfast. 10 tablet Volney American, [place]      PDMP not  reviewed this encounter.   Volney American, [place] 12/19/21 1733

## 2021-12-19 NOTE — ED Triage Notes (Signed)
Pt reports asthma flare-up for last several days. Pt reports last used inhaler approximately noon today. Reports is out of neb treatments.   Airway patent.no audible wheezing noted. Denies pain. Reports intermittent chest tightness.

## 2022-01-17 ENCOUNTER — Encounter: Payer: Self-pay | Admitting: *Deleted

## 2022-02-17 ENCOUNTER — Other Ambulatory Visit: Payer: Self-pay | Admitting: Family Medicine

## 2022-02-18 NOTE — Telephone Encounter (Signed)
Urgent care Patient Requested Prescriptions  Pending Prescriptions Disp Refills   VENTOLIN HFA 108 (90 Base) MCG/ACT inhaler [Pharmacy Med Name: VENTOLIN HFA 90 MCG INHALER] 18 each     Sig: INHALE 2 PUFFS INTO THE LUNGS AS NEEDED FOR WHEEZING OR SHORTNESS OF BREATH.     There is no refill protocol information for this order

## 2022-03-13 NOTE — Progress Notes (Deleted)
GI Office Note    Referring Provider: Iona Beard, MD Primary Care Physician:  Iona Beard, MD  Primary Gastroenterologist: Cristopher Estimable.Rourk, MD  Chief Complaint   No chief complaint on file.   History of Present Illness   Alexis Lucas is a 61 y.o. female presenting today at the request of Iona Beard, MD for ***screening colonoscopy.  Recently diagnosed with breast cancer for which she underwent lumpectomy in February 2023 as well as radiation.  Triaged by nurse earlier this year.  Previously advised that she would need clearance from surgeon prior to proceeding with colonoscopy.  Recently noted history of constipation, using MiraLAX as needed.  Today:   Current Outpatient Medications  Medication Sig Dispense Refill   albuterol (PROVENTIL) (2.5 MG/3ML) 0.083% nebulizer solution Take 2.5 mg by nebulization as needed for wheezing or shortness of breath.     albuterol (PROVENTIL) (2.5 MG/3ML) 0.083% nebulizer solution Take 3 mLs (2.5 mg total) by nebulization every 6 (six) hours as needed for wheezing or shortness of breath. 75 mL 0   albuterol (VENTOLIN HFA) 108 (90 Base) MCG/ACT inhaler Inhale 2 puffs into the lungs as needed for wheezing or shortness of breath. 18 g 0   buPROPion (WELLBUTRIN SR) 150 MG 12 hr tablet Take by mouth daily at 6 (six) AM.     ibuprofen (ADVIL,MOTRIN) 800 MG tablet Take 800 mg by mouth as needed for mild pain or moderate pain.     letrozole (FEMARA) 2.5 MG tablet Take 2.5 mg by mouth daily.     predniSONE (DELTASONE) 20 MG tablet Take 2 tablets (40 mg total) by mouth daily with breakfast. 10 tablet 0   No current facility-administered medications for this visit.    Past Medical History:  Diagnosis Date   Asthma    Breast cancer ([place])    Cervical radiculopathy    Chronic headaches    Neck pain    Ovarian tumor     Past Surgical History:  Procedure Laterality Date   ABDOMINAL HYSTERECTOMY     LEFT OOPHORECTOMY      No family history  on file.  Allergies as of 03/14/2022 - Review Complete 12/19/2021  Allergen Reaction Noted   Penicillins Hives, Shortness Of Breath, Swelling, and Other (See Comments) 01/02/2011   Shellfish allergy  06/18/2016    Social History   Socioeconomic History   Marital status: Legally Separated    Spouse name: Not on file   Number of children: Not on file   Years of education: Not on file   Highest education level: Not on file  Occupational History   Not on file  Tobacco Use   Smoking status: Every Day    Packs/day: 0.50    Types: Cigarettes   Smokeless tobacco: Never  Substance and Sexual Activity   Alcohol use: No   Drug use: Yes    Types: Marijuana    Comment: occasional   Sexual activity: Not on file  Other Topics Concern   Not on file  Social History Narrative   Not on file   Social Determinants of Health   Financial Resource Strain: Not on file  Food Insecurity: Not on file  Transportation Needs: Not on file  Physical Activity: Not on file  Stress: Not on file  Social Connections: Not on file  Intimate Partner Violence: Not on file     Review of Systems   Gen: Denies any fever, chills, fatigue, weight loss, lack of appetite.  CV: Denies  chest pain, heart palpitations, peripheral edema, syncope.  Resp: Denies shortness of breath at rest or with exertion. Denies wheezing or cough.  GI: see HPI GU : Denies urinary burning, urinary frequency, urinary hesitancy MS: Denies joint pain, muscle weakness, cramps, or limitation of movement.  Derm: Denies rash, itching, dry skin Psych: Denies depression, anxiety, memory loss, and confusion Heme: Denies bruising, bleeding, and enlarged lymph nodes.   Physical Exam   There were no vitals taken for this visit.  General:   Alert and oriented. Pleasant and cooperative. Well-nourished and well-developed.  Head:  Normocephalic and atraumatic. Eyes:  Without icterus, sclera clear and conjunctiva pink.  Ears:  Normal  auditory acuity. Mouth:  No deformity or lesions, oral mucosa pink.  Lungs:  Clear to auscultation bilaterally. No wheezes, rales, or rhonchi. No distress.  Heart:  S1, S2 present without murmurs appreciated.  Abdomen:  +BS, soft, non-tender and non-distended. No HSM noted. No guarding or rebound. No masses appreciated.  Rectal:  Deferred *** Msk:  Symmetrical without gross deformities. Normal posture. Extremities:  Without edema. Neurologic:  Alert and  oriented x4;  grossly normal neurologically. Skin:  Intact without significant lesions or rashes. Psych:  Alert and cooperative. Normal mood and affect.   Assessment   Alexis Lucas is a 61 y.o. female with a history of tobacco use, HLD, IBS, emphysema, aorta atherosclerosis, mild CAD, left breast cancer s/p lumpectomy and radiation*** presenting today for evaluation prior to scheduling screening colonoscopy.  Screening for colon cancer:    PLAN   *** Proceed with colonoscopy with propofol by Dr. Gala Romney in near future: the risks, benefits, and alternatives have been discussed with the patient in detail. The patient states understanding and desires to proceed. ASA 2    Venetia Night, MSN, FNP-BC, AGACNP-BC [facility]

## 2022-03-14 ENCOUNTER — Ambulatory Visit: Payer: BC Managed Care – PPO | Admitting: Gastroenterology

## 2022-04-25 ENCOUNTER — Ambulatory Visit (INDEPENDENT_AMBULATORY_CARE_PROVIDER_SITE_OTHER): Payer: BC Managed Care – PPO | Admitting: Gastroenterology

## 2022-04-25 ENCOUNTER — Encounter: Payer: Self-pay | Admitting: Gastroenterology

## 2022-04-25 VITALS — BP 148/69 | HR 80 | Temp 97.8°F | Ht 62.0 in | Wt 115.0 lb

## 2022-04-25 DIAGNOSIS — K219 Gastro-esophageal reflux disease without esophagitis: Secondary | ICD-10-CM

## 2022-04-25 DIAGNOSIS — Z1211 Encounter for screening for malignant neoplasm of colon: Secondary | ICD-10-CM | POA: Diagnosis not present

## 2022-04-25 DIAGNOSIS — R634 Abnormal weight loss: Secondary | ICD-10-CM | POA: Diagnosis not present

## 2022-04-25 MED ORDER — PANTOPRAZOLE SODIUM 20 MG PO TBEC: 20.0000 mg | DELAYED_RELEASE_TABLET | Freq: Every day | ORAL | 1 refills | Status: DC

## 2022-04-25 NOTE — Progress Notes (Signed)
GI Office Note    Referring Provider: Iona Beard, MD Primary Care Physician:  Iona Beard, MD  Primary Gastroenterologist: Elon Alas. Abbey Chatters, DO  Chief Complaint   Chief Complaint  Patient presents with   Colonoscopy    Colonoscopy screening. Feels gassy today. Lots of bloating.    History of Present Illness   Alexis Lucas is a 62 y.o. female presenting today at the request of Iona Beard, MD for evaluation prior to scheduling screening colonoscopy.  Patient had phone triage in February 2023.  Reported history of breast cancer diagnosed in December 2022 and had surgery May 16, 2021.  Did admit to marijuana use about 3 times per week.  BMI 22.  Not on any anticoagulation.  Does have history of asthma.  Labs July 2023: Elevated triglycerides at 163, elevated LDL 115, normal renal function.  Hemoglobin 13.4.  Platelets 198.  Review of referral paperwork patient endorsed history of constipation relieved with use of MiraLAX as needed.  Per review of chart patient has been receiving radiation.  Had urgent care visit in September 2023 for asthma exacerbation.  Recent visit with hematology/oncology with [facility] 04/19/2022.  Noted to be on Femara, still smoking cigarettes and marijuana.  Noted to have recent CT chest, abdomen, pelvis 03/27/2022 showing benign-appearing lung nodules none larger than 3 mm.  DEXA scan 05/11/2021 was normal.  Mammogram 02/14/2022 with no cancer and recommended 44-monthfollow-up.  Scheduled for repeat mammogram in May 2024.   Today: Still completing radiation: Finished last treatment in March or April. Was following every 3 months and now will follow every 6 months. States she was taking protein shakes but now not on food stamps and unable to afford them. States she has never had a big appetite or a big eater as long as she can remember. Does snack here and there. Does eat fruit and a lot of bananas.   Had some bad teeth pulled since prior to radiation.  Food tastes different and has difficulty eating certain things. Has top denture only.   States she is now losing weight again. Had some loss of weight prior to diagnosis and then had gained some weight and now started losing a little more.  Admits to not eating well enough at times. Does not eat while at work when she works 3a-3p. Some days she eats everything and some days "eats like a bird".   Endorsed a history of constipation. Not really having any trouble right now. Goes everyday or every other day. At times not getting it all out. Not taking anything over the counter to help with that.   Has occasional indigestion, has occasional bout of feeling like she can't eat from it. Has heartburn 2-3 times per week depending on her diet. Stopped drinking sodas. Drinks juice and has started drinking at least 1-2 bottles per day.   Had a shoulder replacement a few years ago. Able to use her left arm now.   Fatigue: not sleeping good.   Denies chest pain or shortness of breath. Denise dysphagia.   States she was going to try to have TCS in 2020.   Had lumpectomy done.   Wt Readings from Last 3 Encounters:  04/25/22 115 lb (52.2 kg)  05/29/21 125 lb (56.7 kg)  05/11/21 120 lb (54.4 kg)    Current Outpatient Medications  Medication Sig Dispense Refill   albuterol (PROVENTIL) (2.5 MG/3ML) 0.083% nebulizer solution Take 2.5 mg by nebulization as needed for wheezing or shortness  of breath.     albuterol (PROVENTIL) (2.5 MG/3ML) 0.083% nebulizer solution Take 3 mLs (2.5 mg total) by nebulization every 6 (six) hours as needed for wheezing or shortness of breath. 75 mL 0   albuterol (VENTOLIN HFA) 108 (90 Base) MCG/ACT inhaler Inhale 2 puffs into the lungs as needed for wheezing or shortness of breath. 18 g 0   buPROPion (WELLBUTRIN SR) 150 MG 12 hr tablet Take by mouth daily at 6 (six) AM.     ibuprofen (ADVIL,MOTRIN) 800 MG tablet Take 800 mg by mouth as needed for mild pain or moderate pain.      letrozole (FEMARA) 2.5 MG tablet Take 2.5 mg by mouth daily.     pantoprazole (PROTONIX) 20 MG tablet Take 1 tablet (20 mg total) by mouth daily. 30 tablet 1   No current facility-administered medications for this visit.    Past Medical History:  Diagnosis Date   Asthma    Breast cancer ([place])    Cervical radiculopathy    Chronic headaches    Neck pain    Ovarian tumor     Past Surgical History:  Procedure Laterality Date   ABDOMINAL HYSTERECTOMY     LEFT OOPHORECTOMY      Family History  Problem Relation Age of Onset   Colon cancer Neg Hx    Colon polyps Neg Hx     Allergies as of 04/25/2022 - Review Complete 04/25/2022  Allergen Reaction Noted   Penicillins Hives, Shortness Of Breath, Swelling, and Other (See Comments) 01/02/2011   Shellfish allergy  06/18/2016    Social History   Socioeconomic History   Marital status: Legally Separated    Spouse name: Not on file   Number of children: Not on file   Years of education: Not on file   Highest education level: Not on file  Occupational History   Not on file  Tobacco Use   Smoking status: Every Day    Packs/day: 0.50    Types: Cigarettes   Smokeless tobacco: Never  Substance and Sexual Activity   Alcohol use: No   Drug use: Yes    Types: Marijuana    Comment: occasional   Sexual activity: Not on file  Other Topics Concern   Not on file  Social History Narrative   Not on file   Social Determinants of Health   Financial Resource Strain: Not on file  Food Insecurity: Not on file  Transportation Needs: Not on file  Physical Activity: Not on file  Stress: Not on file  Social Connections: Not on file  Intimate Partner Violence: Not on file     Review of Systems   Gen: Positive mild weight loss denies any fever, chills, fatigue, weight loss CV: Denies chest pain, heart palpitations, peripheral edema, syncope.  Resp: Denies shortness of breath at rest or with exertion. Denies wheezing or cough.  GI:  see HPI GU : Denies urinary burning, urinary frequency, urinary hesitancy MS: Denies joint pain, muscle weakness, cramps, or limitation of movement.  Derm: Denies rash, itching, dry skin Psych: Denies depression, anxiety, memory loss, and confusion Heme: Denies bruising, bleeding, and enlarged lymph nodes.   Physical Exam   BP (!) 148/69 (BP Location: Left Arm, Patient Position: Sitting, Cuff Size: Normal) Comment: BP recheck  Pulse 80   Temp 97.8 F (36.6 C) (Temporal)   Ht '5\' 2"'$  (1.575 m)   Wt 115 lb (52.2 kg)   SpO2 98%   BMI 21.03 kg/m   General:  Alert and oriented. Pleasant and cooperative. Head:  Normocephalic and atraumatic. Eyes:  Without icterus, sclera clear and conjunctiva pink.  Ears:  Normal auditory acuity. Mouth:  No deformity or lesions, oral mucosa pink.  Lungs:  Clear to auscultation bilaterally. No wheezes, rales, or rhonchi. No distress.  Heart:  S1, S2 present without murmurs appreciated.  Abdomen:  +BS, soft, and non-distended.  Mild TTP to epigastrium no HSM noted. No guarding or rebound. No masses appreciated.  Rectal:  Deferred  Msk:  Symmetrical without gross deformities. Normal posture. Extremities:  Without edema. Neurologic:  Alert and  oriented x4;  grossly normal neurologically. Skin:  Intact without significant lesions or rashes. Psych:  Alert and cooperative. Normal mood and affect.   Assessment   PAISLEE SZATKOWSKI is a 62 y.o. female with a history of breast cancer s/p surgery and radiation, chronic headaches, asthma presenting today for evaluation prior to scheduling screening colonoscopy.  Screening for colon cancer: Triage done in February 2023 however given recent surgery for breast cancer, was recommended to be released from surgeon prior to scheduling.  Has some intermittent constipation, nothing significant.  Generally has a bowel movement every other day, sometimes every day.  Denies any melena, BRBPR.  Has had some recent weight loss  however this is likely secondary to her breast cancer treatment as well as lifestyle factors.  Still has some reflux as stated below.  She states has been trying to get her colonoscopy done since early 2020 but has had multiple setbacks including the pandemic and diagnosis of breast cancer.  She is ready to proceed.  GERD: Has been having symptoms about 3 times per week.  Somewhat exacerbated by diet however she has cut out sodas and tries to avoid tomato-based products used to have some intermittent symptoms.  Does have some mild TTP to epigastrium on exam today.  Will start on low-dose pantoprazole 20 mg daily.  Loss of weight: Has lost 10 lbs in the last year. Lost some prior to breast cancer surgery and had gained some back with taking Ensure however that time she was obtaining it through food stamps but she does not have anymore.  We discussed the need to follow a high-protein/high-calorie diet and if able to obtain nutrition shakes that she should take these as much as able.  Will continue to monitor weight weekly.  Encouraged more frequent meals.  She had recent CT chest, abdomen, and pelvis without any abnormality.  She is to notify for any significant drop in weight.  Will plan to follow-up in 4 months.  Hypertension: Initial BP 157/88.  Recheck BP 148/69. The patient was found to have elevated blood pressure when vital signs were checked in the office. The blood pressure was rechecked by the nursing staff and it was found be persistently elevated >140/90 mmHg. I personally advised to the patient to follow up closely with PCP for hypertension control.   PLAN   Continue to monitor weight, consider additional workup if ongoing. Proceed with colonoscopy with propofol by Dr. Abbey Chatters  in near future: the risks, benefits, and alternatives have been discussed with the patient in detail. The patient states understanding and desires to proceed. ASA 2 Higher protein, high calorie diet If able, drink a  nutrition shake one per day as able.  Start pantoprazole 20 mg daily.  Eat 4-6 smaller meals a day with higher calories.  Follow-up with PCP for blood pressure management Follow up in 4 months.    Venetia Night,  MSN, FNP-BC, AGACNP-BC [facility]

## 2022-04-25 NOTE — H&P (View-Only) (Signed)
GI Office Note    Referring Provider: Iona Beard, MD Primary Care Physician:  Iona Beard, MD  Primary Gastroenterologist: Elon Alas. Abbey Chatters, DO  Chief Complaint   Chief Complaint  Patient presents with   Colonoscopy    Colonoscopy screening. Feels gassy today. Lots of bloating.    History of Present Illness   Alexis Lucas is a 62 y.o. female presenting today at the request of Iona Beard, MD for evaluation prior to scheduling screening colonoscopy.  Patient had phone triage in February 2023.  Reported history of breast cancer diagnosed in December 2022 and had surgery May 16, 2021.  Did admit to marijuana use about 3 times per week.  BMI 22.  Not on any anticoagulation.  Does have history of asthma.  Labs July 2023: Elevated triglycerides at 163, elevated LDL 115, normal renal function.  Hemoglobin 13.4.  Platelets 198.  Review of referral paperwork patient endorsed history of constipation relieved with use of MiraLAX as needed.  Per review of chart patient has been receiving radiation.  Had urgent care visit in September 2023 for asthma exacerbation.  Recent visit with hematology/oncology with [facility] 04/19/2022.  Noted to be on Femara, still smoking cigarettes and marijuana.  Noted to have recent CT chest, abdomen, pelvis 03/27/2022 showing benign-appearing lung nodules none larger than 3 mm.  DEXA scan 05/11/2021 was normal.  Mammogram 02/14/2022 with no cancer and recommended 72-monthfollow-up.  Scheduled for repeat mammogram in May 2024.   Today: Still completing radiation: Finished last treatment in March or April. Was following every 3 months and now will follow every 6 months. States she was taking protein shakes but now not on food stamps and unable to afford them. States she has never had a big appetite or a big eater as long as she can remember. Does snack here and there. Does eat fruit and a lot of bananas.   Had some bad teeth pulled since prior to radiation.  Food tastes different and has difficulty eating certain things. Has top denture only.   States she is now losing weight again. Had some loss of weight prior to diagnosis and then had gained some weight and now started losing a little more.  Admits to not eating well enough at times. Does not eat while at work when she works 3a-3p. Some days she eats everything and some days "eats like a bird".   Endorsed a history of constipation. Not really having any trouble right now. Goes everyday or every other day. At times not getting it all out. Not taking anything over the counter to help with that.   Has occasional indigestion, has occasional bout of feeling like she can't eat from it. Has heartburn 2-3 times per week depending on her diet. Stopped drinking sodas. Drinks juice and has started drinking at least 1-2 bottles per day.   Had a shoulder replacement a few years ago. Able to use her left arm now.   Fatigue: not sleeping good.   Denies chest pain or shortness of breath. Denise dysphagia.   States she was going to try to have TCS in 2020.   Had lumpectomy done.   Wt Readings from Last 3 Encounters:  04/25/22 115 lb (52.2 kg)  05/29/21 125 lb (56.7 kg)  05/11/21 120 lb (54.4 kg)    Current Outpatient Medications  Medication Sig Dispense Refill   albuterol (PROVENTIL) (2.5 MG/3ML) 0.083% nebulizer solution Take 2.5 mg by nebulization as needed for wheezing or shortness  of breath.     albuterol (PROVENTIL) (2.5 MG/3ML) 0.083% nebulizer solution Take 3 mLs (2.5 mg total) by nebulization every 6 (six) hours as needed for wheezing or shortness of breath. 75 mL 0   albuterol (VENTOLIN HFA) 108 (90 Base) MCG/ACT inhaler Inhale 2 puffs into the lungs as needed for wheezing or shortness of breath. 18 g 0   buPROPion (WELLBUTRIN SR) 150 MG 12 hr tablet Take by mouth daily at 6 (six) AM.     ibuprofen (ADVIL,MOTRIN) 800 MG tablet Take 800 mg by mouth as needed for mild pain or moderate pain.      letrozole (FEMARA) 2.5 MG tablet Take 2.5 mg by mouth daily.     pantoprazole (PROTONIX) 20 MG tablet Take 1 tablet (20 mg total) by mouth daily. 30 tablet 1   No current facility-administered medications for this visit.    Past Medical History:  Diagnosis Date   Asthma    Breast cancer ([place])    Cervical radiculopathy    Chronic headaches    Neck pain    Ovarian tumor     Past Surgical History:  Procedure Laterality Date   ABDOMINAL HYSTERECTOMY     LEFT OOPHORECTOMY      Family History  Problem Relation Age of Onset   Colon cancer Neg Hx    Colon polyps Neg Hx     Allergies as of 04/25/2022 - Review Complete 04/25/2022  Allergen Reaction Noted   Penicillins Hives, Shortness Of Breath, Swelling, and Other (See Comments) 01/02/2011   Shellfish allergy  06/18/2016    Social History   Socioeconomic History   Marital status: Legally Separated    Spouse name: Not on file   Number of children: Not on file   Years of education: Not on file   Highest education level: Not on file  Occupational History   Not on file  Tobacco Use   Smoking status: Every Day    Packs/day: 0.50    Types: Cigarettes   Smokeless tobacco: Never  Substance and Sexual Activity   Alcohol use: No   Drug use: Yes    Types: Marijuana    Comment: occasional   Sexual activity: Not on file  Other Topics Concern   Not on file  Social History Narrative   Not on file   Social Determinants of Health   Financial Resource Strain: Not on file  Food Insecurity: Not on file  Transportation Needs: Not on file  Physical Activity: Not on file  Stress: Not on file  Social Connections: Not on file  Intimate Partner Violence: Not on file     Review of Systems   Gen: Positive mild weight loss denies any fever, chills, fatigue, weight loss CV: Denies chest pain, heart palpitations, peripheral edema, syncope.  Resp: Denies shortness of breath at rest or with exertion. Denies wheezing or cough.  GI:  see HPI GU : Denies urinary burning, urinary frequency, urinary hesitancy MS: Denies joint pain, muscle weakness, cramps, or limitation of movement.  Derm: Denies rash, itching, dry skin Psych: Denies depression, anxiety, memory loss, and confusion Heme: Denies bruising, bleeding, and enlarged lymph nodes.   Physical Exam   BP (!) 148/69 (BP Location: Left Arm, Patient Position: Sitting, Cuff Size: Normal) Comment: BP recheck  Pulse 80   Temp 97.8 F (36.6 C) (Temporal)   Ht '5\' 2"'$  (1.575 m)   Wt 115 lb (52.2 kg)   SpO2 98%   BMI 21.03 kg/m   General:  Alert and oriented. Pleasant and cooperative. Head:  Normocephalic and atraumatic. Eyes:  Without icterus, sclera clear and conjunctiva pink.  Ears:  Normal auditory acuity. Mouth:  No deformity or lesions, oral mucosa pink.  Lungs:  Clear to auscultation bilaterally. No wheezes, rales, or rhonchi. No distress.  Heart:  S1, S2 present without murmurs appreciated.  Abdomen:  +BS, soft, and non-distended.  Mild TTP to epigastrium no HSM noted. No guarding or rebound. No masses appreciated.  Rectal:  Deferred  Msk:  Symmetrical without gross deformities. Normal posture. Extremities:  Without edema. Neurologic:  Alert and  oriented x4;  grossly normal neurologically. Skin:  Intact without significant lesions or rashes. Psych:  Alert and cooperative. Normal mood and affect.   Assessment   Alexis Lucas is a 62 y.o. female with a history of breast cancer s/p surgery and radiation, chronic headaches, asthma presenting today for evaluation prior to scheduling screening colonoscopy.  Screening for colon cancer: Triage done in February 2023 however given recent surgery for breast cancer, was recommended to be released from surgeon prior to scheduling.  Has some intermittent constipation, nothing significant.  Generally has a bowel movement every other day, sometimes every day.  Denies any melena, BRBPR.  Has had some recent weight loss  however this is likely secondary to her breast cancer treatment as well as lifestyle factors.  Still has some reflux as stated below.  She states has been trying to get her colonoscopy done since early 2020 but has had multiple setbacks including the pandemic and diagnosis of breast cancer.  She is ready to proceed.  GERD: Has been having symptoms about 3 times per week.  Somewhat exacerbated by diet however she has cut out sodas and tries to avoid tomato-based products used to have some intermittent symptoms.  Does have some mild TTP to epigastrium on exam today.  Will start on low-dose pantoprazole 20 mg daily.  Loss of weight: Has lost 10 lbs in the last year. Lost some prior to breast cancer surgery and had gained some back with taking Ensure however that time she was obtaining it through food stamps but she does not have anymore.  We discussed the need to follow a high-protein/high-calorie diet and if able to obtain nutrition shakes that she should take these as much as able.  Will continue to monitor weight weekly.  Encouraged more frequent meals.  She had recent CT chest, abdomen, and pelvis without any abnormality.  She is to notify for any significant drop in weight.  Will plan to follow-up in 4 months.  Hypertension: Initial BP 157/88.  Recheck BP 148/69. The patient was found to have elevated blood pressure when vital signs were checked in the office. The blood pressure was rechecked by the nursing staff and it was found be persistently elevated >140/90 mmHg. I personally advised to the patient to follow up closely with PCP for hypertension control.   PLAN   Continue to monitor weight, consider additional workup if ongoing. Proceed with colonoscopy with propofol by Dr. Abbey Chatters  in near future: the risks, benefits, and alternatives have been discussed with the patient in detail. The patient states understanding and desires to proceed. ASA 2 Higher protein, high calorie diet If able, drink a  nutrition shake one per day as able.  Start pantoprazole 20 mg daily.  Eat 4-6 smaller meals a day with higher calories.  Follow-up with PCP for blood pressure management Follow up in 4 months.    Venetia Night,  MSN, FNP-BC, AGACNP-BC [facility]

## 2022-04-25 NOTE — Patient Instructions (Addendum)
We are scheduling you for a colonoscopy in the near future with Dr. Abbey Chatters.   Monitor weight weekly. Let us know if you have large drop in weight.   High protein, high calorie diet. Eat 4-6 smaller meals. If able to afford it, I would recommend daily nutritional shake as able.   Start pantoprazole 20 mg daily, 30 minutes prior to meal on empty stomach.  Follow a GERD diet:  Avoid fried, fatty, greasy, spicy, citrus foods. Avoid caffeine and carbonated beverages. Avoid chocolate. Try eating 4-6 small meals a day rather than 3 large meals. Do not eat within 3 hours of laying down. Prop head of bed up on wood or bricks to create a 6 inch incline.   It was a pleasure to see you today. I want to create trusting relationships with patients. If you receive a survey regarding your visit,  I greatly appreciate you taking time to fill this out on paper or through your MyChart. I value your feedback.  Venetia Night, MSN, FNP-BC, AGACNP-BC [facility]

## 2022-04-28 ENCOUNTER — Other Ambulatory Visit: Payer: Self-pay

## 2022-04-28 ENCOUNTER — Emergency Department (HOSPITAL_COMMUNITY): Payer: BC Managed Care – PPO

## 2022-04-28 ENCOUNTER — Emergency Department (HOSPITAL_COMMUNITY)
Admission: EM | Admit: 2022-04-28 | Discharge: 2022-04-28 | Disposition: A | Discharge status: Home / Self Care | Payer: BC Managed Care – PPO | Attending: Emergency Medicine | Admitting: Emergency Medicine

## 2022-04-28 ENCOUNTER — Encounter (HOSPITAL_COMMUNITY): Payer: Self-pay

## 2022-04-28 DIAGNOSIS — R1031 Right lower quadrant pain: Secondary | ICD-10-CM | POA: Diagnosis not present

## 2022-04-28 DIAGNOSIS — K5900 Constipation, unspecified: Secondary | ICD-10-CM | POA: Diagnosis not present

## 2022-04-28 DIAGNOSIS — I700 Atherosclerosis of aorta: Secondary | ICD-10-CM | POA: Diagnosis not present

## 2022-04-28 DIAGNOSIS — R109 Unspecified abdominal pain: Secondary | ICD-10-CM | POA: Diagnosis not present

## 2022-04-28 LAB — COMPREHENSIVE METABOLIC PANEL
ALT: 10 U/L (ref 0–44)
AST: 14 U/L — ABNORMAL LOW (ref 15–41)
Albumin: 3.9 g/dL (ref 3.5–5.0)
Alkaline Phosphatase: 58 U/L (ref 38–126)
Anion gap: 7 (ref 5–15)
BUN: 7 mg/dL (ref 6–20)
CO2: 22 mmol/L (ref 22–32)
Calcium: 8.7 mg/dL — ABNORMAL LOW (ref 8.9–10.3)
Chloride: 108 mmol/L (ref 98–111)
Creatinine, Ser: 0.66 mg/dL (ref 0.44–1.00)
GFR, Estimated: >60 mL/min (ref >60)
Glucose, Bld: 108 mg/dL — ABNORMAL HIGH (ref 70–99)
Potassium: 3.7 mmol/L (ref 3.5–5.1)
Sodium: 137 mmol/L (ref 135–145)
Total Bilirubin: 0.4 mg/dL (ref 0.3–1.2)
Total Protein: 7 g/dL (ref 6.5–8.1)

## 2022-04-28 LAB — URINALYSIS, ROUTINE W REFLEX MICROSCOPIC
Bilirubin Urine: NEGATIVE
Glucose, UA: NEGATIVE mg/dL
Hgb urine dipstick: NEGATIVE
Ketones, ur: NEGATIVE mg/dL
Leukocytes,Ua: NEGATIVE
Nitrite: NEGATIVE
Protein, ur: NEGATIVE mg/dL
Specific Gravity, Urine: 1.003 — ABNORMAL LOW (ref 1.005–1.030)
pH: 6 (ref 5.0–8.0)

## 2022-04-28 LAB — CBC
HCT: 38.6 % (ref 36.0–46.0)
Hemoglobin: 13.1 g/dL (ref 12.0–15.0)
MCH: 30.7 pg (ref 26.0–34.0)
MCHC: 33.9 g/dL (ref 30.0–36.0)
MCV: 90.4 fL (ref 80.0–100.0)
Platelets: 165 10*3/uL (ref 150–400)
RBC: 4.27 MIL/uL (ref 3.87–5.11)
RDW: 13.9 % (ref 11.5–15.5)
WBC: 4 10*3/uL (ref 4.0–10.5)
nRBC: 0 % (ref 0.0–0.2)

## 2022-04-28 LAB — I-STAT BETA HCG BLOOD, ED (MC, WL, AP ONLY): I-stat hCG, quantitative: <5 m[IU]/mL (ref <5)

## 2022-04-28 LAB — LIPASE, BLOOD: Lipase: 39 U/L (ref 11–51)

## 2022-04-28 MED ORDER — IOHEXOL 350 MG/ML SOLN
75.0000 mL | Freq: Once | INTRAVENOUS | Status: AC | PRN
  Administered 2022-04-28: 75 mL via INTRAVENOUS

## 2022-04-28 NOTE — ED Triage Notes (Addendum)
Reports having right lower abd pain.  Also having periods of dizziness and almost passing out. Patient hx of breast cancer.  Denies n/v/d patient having frequent PVCs per EKG

## 2022-04-28 NOTE — ED Provider Notes (Signed)
[facility] EMERGENCY DEPARTMENT Provider Note   CSN: [id number] Arrival date & time: 04/28/22  0920     History  Chief Complaint  Patient presents with   Dizziness   Abdominal Pain    Alexis Lucas is a 62 y.o. female.  Patient is a 62 year old female with a history of prior breast cancer, ovarian mass status post resection who is presenting today with complaint of right lower quadrant pain.  Patient reports that for the last week she has had intermittent pain in this area.  Sometimes it is so severe it causes her to be lightheaded and dizzy.  She has not noticed any swelling in her leg but does have some pain when she stretches out her leg.  She has not noticed any bulging in the area but just reports it feels full.  She denies any urinary symptoms or change in bowel habits.  Her diet has not significantly changed.  She reports some days she likes to eat more than others but has noted a 30 pound weight loss and is following up with her oncologist for this.  Mid December she had a scan from her chest through her pelvis that did not find any evidence of metastases.  She has not had any recent medication changes but they have told her to start using boost.  She has not had fever, cough congestion or shortness of breath.  She has no prior history of blood clots.  The history is provided by the patient.  Dizziness Abdominal Pain      Home Medications Prior to Admission medications   Medication Sig Start Date End Date Taking? Authorizing Provider  albuterol (PROVENTIL) (2.5 MG/3ML) 0.083% nebulizer solution Take 2.5 mg by nebulization as needed for wheezing or shortness of breath.    [provider]  albuterol (PROVENTIL) (2.5 MG/3ML) 0.083% nebulizer solution Take 3 mLs (2.5 mg total) by nebulization every 6 (six) hours as needed for wheezing or shortness of breath. 12/19/21   Volney American, PA-C  albuterol (VENTOLIN HFA) 108 (90 Base) MCG/ACT inhaler  Inhale 2 puffs into the lungs as needed for wheezing or shortness of breath. 12/19/21   Volney American, PA-C  buPROPion (WELLBUTRIN SR) 150 MG 12 hr tablet Take by mouth daily at 6 (six) AM.    [provider]  ibuprofen (ADVIL,MOTRIN) 800 MG tablet Take 800 mg by mouth as needed for mild pain or moderate pain.    [provider]  letrozole (FEMARA) 2.5 MG tablet Take 2.5 mg by mouth daily. 05/11/21   [provider]  pantoprazole (PROTONIX) 20 MG tablet Take 1 tablet (20 mg total) by mouth daily. 04/25/22   Sherron Monday, NP      Allergies    Penicillins and Shellfish allergy    Review of Systems   Review of Systems  Gastrointestinal:  Positive for abdominal pain.  Neurological:  Positive for dizziness.    Physical Exam Updated Vital Signs BP 121/70   Pulse 63   Temp 98.2 F (36.8 C) (Oral)   Resp 19   Ht '5\' 2"'$  (1.575 m)   Wt 52.2 kg   SpO2 99%   BMI 21.03 kg/m  Physical Exam Vitals and nursing note reviewed.  Constitutional:      General: She is not in acute distress.    Appearance: She is well-developed.  HENT:     Head: Normocephalic and atraumatic.  Eyes:     Pupils: Pupils are equal,  round, and reactive to light.  Cardiovascular:     Rate and Rhythm: Normal rate and regular rhythm.     Pulses: Normal pulses.     Heart sounds: Normal heart sounds. No murmur heard.    No friction rub.  Pulmonary:     Effort: Pulmonary effort is normal.     Breath sounds: Normal breath sounds. No wheezing or rales.  Abdominal:     General: Bowel sounds are normal. There is no distension.     Palpations: Abdomen is soft.     Tenderness: There is abdominal tenderness in the right lower quadrant. There is right CVA tenderness and guarding. There is no rebound.     Hernia: No hernia is present.  Musculoskeletal:        General: No tenderness. Normal range of motion.     Right lower leg: No edema.     Left lower leg: No edema.     Comments: No  edema.  No tenderness of the calf or noted leg swelling.  Femoral pulse is 2+ and nontender to palpation.  Skin:    General: Skin is warm and dry.     Findings: No rash.  Neurological:     Mental Status: She is alert and oriented to person, place, and time. Mental status is at baseline.     Cranial Nerves: No cranial nerve deficit.  Psychiatric:        Behavior: Behavior normal.     ED Results / Procedures / Treatments   Labs (all labs ordered are listed, but only abnormal results are displayed) Labs Reviewed  COMPREHENSIVE METABOLIC PANEL - Abnormal; Notable for the following components:      Result Value   Glucose, Bld 108 (*)    Calcium 8.7 (*)    AST 14 (*)    All other components within normal limits  URINALYSIS, ROUTINE W REFLEX MICROSCOPIC - Abnormal; Notable for the following components:   Color, Urine STRAW (*)    Specific Gravity, Urine 1.003 (*)    All other components within normal limits  LIPASE, BLOOD  CBC  I-STAT BETA HCG BLOOD, ED ([facility], [facility], [facility] ONLY)    EKG None  Radiology CT ABDOMEN PELVIS W CONTRAST  Result Date: 04/28/2022 CLINICAL DATA:  RLQ abdominal pain EXAM: CT ABDOMEN AND PELVIS WITH CONTRAST TECHNIQUE: Multidetector CT imaging of the abdomen and pelvis was performed using the standard protocol following bolus administration of intravenous contrast. RADIATION DOSE REDUCTION: This exam was performed according to the departmental dose-optimization program which includes automated exposure control, adjustment of the mA and/or kV according to patient size and/or use of iterative reconstruction technique. CONTRAST:  41m OMNIPAQUE IOHEXOL 350 MG/ML SOLN COMPARISON:  August 03, 2014. FINDINGS: Lower chest: Bibasilar atelectasis. Hepatobiliary: Focal fatty deposition adjacent to the falciform ligament. Gallbladder is unremarkable. Portal vein is patent. No intrahepatic or extrahepatic biliary ductal dilation. Pancreas: Multiple punctate calcifications throughout  the pancreas favored to reflect the sequela of remote prior pancreatitis. No suspicious mass or adjacent fat stranding. Spleen: Normal in size without focal abnormality. Adrenals/Urinary Tract: Adrenal glands are unremarkable. Kidneys enhance symmetrically. Malrotated LEFT pelvic kidney. No hydronephrosis. No obstructing nephrolithiasis. Bladder is unremarkable for degree of distension. Stomach/Bowel: No evidence of bowel obstruction. Appendix is normal. Moderate colonic stool burden throughout the colon. Stomach is unremarkable for degree of distension. Vascular/Lymphatic: Atherosclerotic calcifications of the nonaneurysmal abdominal aorta. No new suspicious lymphadenopathy noted. Reproductive: Status post hysterectomy. No adnexal masses. Other: No free air. Musculoskeletal:  No acute or significant osseous findings. IMPRESSION: 1. No CT etiology for acute abdominal pain identified. Normal appendix. 2. Moderate colonic stool burden throughout the colon. Aortic Atherosclerosis (ICD10-I70.0). Electronically Signed   By: Valentino Saxon M.D.   On: 04/28/2022 13:57    Procedures Procedures    Medications Ordered in ED Medications  iohexol (OMNIPAQUE) 350 MG/ML injection 75 mL (75 mLs Intravenous Contrast Given 04/28/22 1323)    ED Course/ Medical Decision Making/ A&P                             Medical Decision Making Amount and/or Complexity of Data Reviewed External Data Reviewed: notes.    Details: onc Labs: ordered. Decision-making details documented in ED Course. Radiology: ordered and independent interpretation performed. Decision-making details documented in ED Course.  Risk Prescription drug management.   Pt with multiple medical problems and comorbidities and presenting today with a complaint that caries a high risk for morbidity and mortality.  Here today with worsening right lower quadrant pain with some associated flank pain.  Concern for possible kidney stone versus ovarian  pathology versus mass versus appendicitis versus atypical diverticulitis versus musculoskeletal.  Lower suspicion for masses patient did have a CT done a month ago that did not show any evidence of metastases.  I independently interpreted patient's labs today and UA was within normal limits, lipase within normal limits, CMP without acute findings including negative LFTs and CBC within normal limits.  Given patient's significant pain and ongoing symptoms we will do a CT for further evaluation for the above pathologies.  2:57 PM I have independently visualized and interpreted pt's images today.  CT today without evidence of hydronephrosis, appendicitis, ovarian pathology.  Radiology reports no acute findings except for constipation.  Findings discussed with the patient.  She reports that she her last bowel movement was today but was very small and her last full bowel movement was a few weeks ago.  She will start using MiraLAX.  She will follow-up with PCP if not improving was given return precautions.         Final Clinical Impression(s) / ED Diagnoses Final diagnoses:  Right lower quadrant abdominal pain  Constipation, unspecified constipation type    Rx / DC Orders ED Discharge Orders     None         Blanchie Dessert, MD 04/28/22 1459

## 2022-04-28 NOTE — Discharge Instructions (Addendum)
The CAT scan today is normal without any signs of intestinal problems or masses.  No signs of kidney stones or urinary tract infections.  It does look like you are constipated.  Start using MiraLAX 1 scoop in 8 ounces of fluid daily until you start having regular bowel movements.  See if this helps with the pain.  If you continue to have pain after this follow-up with your doctor

## 2022-04-29 ENCOUNTER — Telehealth: Payer: Self-pay | Admitting: *Deleted

## 2022-04-29 NOTE — Telephone Encounter (Signed)
Colonoscopy with Dr.Carver, ASA 2 Screening colon cancer,GERD,loss of weight.  [place]

## 2022-05-09 ENCOUNTER — Encounter: Payer: Self-pay | Admitting: *Deleted

## 2022-05-09 NOTE — Telephone Encounter (Signed)
[facility].. Will mail letter

## 2022-05-10 ENCOUNTER — Other Ambulatory Visit: Payer: Self-pay | Admitting: *Deleted

## 2022-05-10 MED ORDER — PEG 3350-KCL-NA BICARB-NACL 420 G PO SOLR: 4000.0000 mL | Freq: Once | ORAL | 0 refills | Status: AC

## 2022-05-10 NOTE — Telephone Encounter (Signed)
Pt called back. She has been scheduled for 2/1 at 9:15am. Aware will send instructions to her via mychart as she said she has access to this. Aware will send rx to pharmacy.

## 2022-05-16 ENCOUNTER — Encounter (HOSPITAL_COMMUNITY): Payer: Self-pay

## 2022-05-16 ENCOUNTER — Ambulatory Visit (HOSPITAL_COMMUNITY): Payer: BC Managed Care – PPO | Admitting: Anesthesiology

## 2022-05-16 ENCOUNTER — Encounter (HOSPITAL_COMMUNITY)
Admission: RE | Disposition: A | Discharge status: Home / Self Care | Payer: Self-pay | Source: Ambulatory Visit | Attending: Internal Medicine

## 2022-05-16 ENCOUNTER — Ambulatory Visit (HOSPITAL_COMMUNITY)
Admission: RE | Admit: 2022-05-16 | Discharge: 2022-05-16 | Disposition: A | Discharge status: Home / Self Care | Payer: BC Managed Care – PPO | Source: Ambulatory Visit | Attending: Internal Medicine | Admitting: Internal Medicine

## 2022-05-16 DIAGNOSIS — Z1211 Encounter for screening for malignant neoplasm of colon: Secondary | ICD-10-CM | POA: Diagnosis not present

## 2022-05-16 DIAGNOSIS — Z79811 Long term (current) use of aromatase inhibitors: Secondary | ICD-10-CM | POA: Diagnosis not present

## 2022-05-16 DIAGNOSIS — D120 Benign neoplasm of cecum: Secondary | ICD-10-CM

## 2022-05-16 DIAGNOSIS — F1290 Cannabis use, unspecified, uncomplicated: Secondary | ICD-10-CM | POA: Insufficient documentation

## 2022-05-16 DIAGNOSIS — J45909 Unspecified asthma, uncomplicated: Secondary | ICD-10-CM | POA: Diagnosis not present

## 2022-05-16 DIAGNOSIS — Z79899 Other long term (current) drug therapy: Secondary | ICD-10-CM | POA: Diagnosis not present

## 2022-05-16 DIAGNOSIS — Z853 Personal history of malignant neoplasm of breast: Secondary | ICD-10-CM | POA: Insufficient documentation

## 2022-05-16 DIAGNOSIS — F17210 Nicotine dependence, cigarettes, uncomplicated: Secondary | ICD-10-CM | POA: Diagnosis not present

## 2022-05-16 DIAGNOSIS — E781 Pure hyperglyceridemia: Secondary | ICD-10-CM | POA: Diagnosis not present

## 2022-05-16 DIAGNOSIS — K219 Gastro-esophageal reflux disease without esophagitis: Secondary | ICD-10-CM | POA: Diagnosis not present

## 2022-05-16 DIAGNOSIS — K649 Unspecified hemorrhoids: Secondary | ICD-10-CM | POA: Diagnosis not present

## 2022-05-16 DIAGNOSIS — Z9889 Other specified postprocedural states: Secondary | ICD-10-CM | POA: Diagnosis not present

## 2022-05-16 DIAGNOSIS — I10 Essential (primary) hypertension: Secondary | ICD-10-CM | POA: Insufficient documentation

## 2022-05-16 DIAGNOSIS — K635 Polyp of colon: Secondary | ICD-10-CM | POA: Diagnosis not present

## 2022-05-16 DIAGNOSIS — K5900 Constipation, unspecified: Secondary | ICD-10-CM | POA: Insufficient documentation

## 2022-05-16 DIAGNOSIS — R1310 Dysphagia, unspecified: Secondary | ICD-10-CM | POA: Diagnosis not present

## 2022-05-16 DIAGNOSIS — K648 Other hemorrhoids: Secondary | ICD-10-CM | POA: Diagnosis not present

## 2022-05-16 HISTORY — PX: COLONOSCOPY WITH PROPOFOL: SHX5780

## 2022-05-16 HISTORY — PX: POLYPECTOMY: SHX5525

## 2022-05-16 HISTORY — DX: Gastro-esophageal reflux disease without esophagitis: K21.9

## 2022-05-16 SURGERY — COLONOSCOPY WITH PROPOFOL
Anesthesia: General

## 2022-05-16 MED ORDER — LACTATED RINGERS IV SOLN: INTRAVENOUS | Status: DC

## 2022-05-16 MED ORDER — PROPOFOL 10 MG/ML IV BOLUS
INTRAVENOUS | Status: DC | PRN
  Administered 2022-05-16: 80 mg via INTRAVENOUS
  Administered 2022-05-16: 20 mg via INTRAVENOUS

## 2022-05-16 MED ORDER — LIDOCAINE HCL (CARDIAC) PF 100 MG/5ML IV SOSY
PREFILLED_SYRINGE | INTRAVENOUS | Status: DC | PRN
  Administered 2022-05-16: 50 mg via INTRATRACHEAL

## 2022-05-16 MED ORDER — STERILE WATER FOR IRRIGATION IR SOLN
Status: DC | PRN
  Administered 2022-05-16: 60 mL

## 2022-05-16 MED ORDER — PROPOFOL 500 MG/50ML IV EMUL
INTRAVENOUS | Status: DC | PRN
  Administered 2022-05-16: 200 ug/kg/min via INTRAVENOUS

## 2022-05-16 NOTE — Op Note (Signed)
[facility] Patient Name: Alexis Lucas Procedure Date: 05/16/2022 9:02 AM MRN: [id number] Date of Birth: Aug 22, 1960 Attending MD: Elon Alas. Abbey Chatters , [place], [id number] CSN: [id number] Age: 62 Admit Type: Outpatient Procedure:                Colonoscopy Indications:              Screening for colorectal malignant neoplasm Providers:                Elon Alas. Abbey Chatters, DO, Caprice Kluver, Aram Candela Referring MD:              Medicines:                See the Anesthesia note for documentation of the                            administered medications Complications:            No immediate complications. Estimated Blood Loss:     Estimated blood loss was minimal. Procedure:                Pre-Anesthesia Assessment:                           - The anesthesia plan was to use monitored                            anesthesia care (MAC).                           After obtaining informed consent, the colonoscope                            was passed under direct vision. Throughout the                            procedure, the patient's blood pressure, pulse, and                            oxygen saturations were monitored continuously. The                            [id number]-[id number] ([id number]) was introduced through the                            anus and advanced to the the cecum, identified by                            appendiceal orifice and ileocecal valve. The                            colonoscopy was performed without difficulty. The                            patient tolerated the procedure well. The quality                            of  the bowel preparation was evaluated using the                            BBPS [facility] Bowel Preparation Scale) with scores                            of: Right Colon = 3, Transverse Colon = 3 and Left                            Colon = 3 (entire mucosa seen well with no residual                            staining, small fragments of stool or opaque                             liquid). The total BBPS score equals 9. Scope In: 9:18:50 AM Scope Out: 9:30:13 AM Scope Withdrawal Time: 0 hours 9 minutes 20 seconds  Total Procedure Duration: 0 hours 11 minutes 23 seconds  Findings:      The perianal and digital rectal examinations were normal.      Non-bleeding internal hemorrhoids were found during endoscopy.      Three sessile polyps were found in the cecum. The polyps were 3 to 5 mm       in size. These polyps were removed with a cold snare. Resection and       retrieval were complete.      The exam was otherwise without abnormality. Impression:               - Non-bleeding internal hemorrhoids.                           - Three 3 to 5 mm polyps in the cecum, removed with                            a cold snare. Resected and retrieved.                           - The examination was otherwise normal. Moderate Sedation:      Per Anesthesia Care Recommendation:           - Patient has a contact number available for                            emergencies. The signs and symptoms of potential                            delayed complications were discussed with the                            patient. Return to normal activities tomorrow.                            Written discharge instructions were provided to the  patient.                           - Resume previous diet.                           - Continue present medications.                           - Await pathology results.                           - Repeat colonoscopy in 5 years for surveillance.                           - Return to GI clinic in 3 months. Procedure Code(s):        --- Professional ---                           [phone], Colonoscopy, flexible; with removal of                            tumor(s), polyp(s), or other lesion(s) by snare                            technique Diagnosis Code(s):        --- Professional ---                           Z12.11,  Encounter for screening for malignant                            neoplasm of colon                           D12.0, Benign neoplasm of cecum                           K64.8, Other hemorrhoids CPT copyright 2022 American Medical Association. All rights reserved. The codes documented in this report are preliminary and upon coder review may  be revised to meet current compliance requirements. Elon Alas. Abbey Chatters, DO [place] Abbey Chatters, DO 05/16/2022 9:33:35 AM This report has been signed electronically. Number of Addenda: 0

## 2022-05-16 NOTE — Discharge Instructions (Addendum)
  Colonoscopy Discharge Instructions  Read the instructions outlined below and refer to this sheet in the next few weeks. These discharge instructions provide you with general information on caring for yourself after you leave the hospital. Your doctor may also give you specific instructions. While your treatment has been planned according to the most current medical practices available, unavoidable complications occasionally occur.   ACTIVITY You may resume your regular activity, but move at a slower pace for the next 24 hours.  Take frequent rest periods for the next 24 hours.  Walking will help get rid of the air and reduce the bloated feeling in your belly (abdomen).  No driving for 24 hours (because of the medicine (anesthesia) used during the test).   Do not sign any important legal documents or operate any machinery for 24 hours (because of the anesthesia used during the test).  NUTRITION Drink plenty of fluids.  You may resume your normal diet as instructed by your doctor.  Begin with a light meal and progress to your normal diet. Heavy or fried foods are harder to digest and may make you feel sick to your stomach (nauseated).  Avoid alcoholic beverages for 24 hours or as instructed.  MEDICATIONS You may resume your normal medications unless your doctor tells you otherwise.  WHAT YOU CAN EXPECT TODAY Some feelings of bloating in the abdomen.  Passage of more gas than usual.  Spotting of blood in your stool or on the toilet paper.  IF YOU HAD POLYPS REMOVED DURING THE COLONOSCOPY: No aspirin products for 7 days or as instructed.  No alcohol for 7 days or as instructed.  Eat a soft diet for the next 24 hours.  FINDING OUT THE RESULTS OF YOUR TEST Not all test results are available during your visit. If your test results are not back during the visit, make an appointment with your caregiver to find out the results. Do not assume everything is normal if you have not heard from your  caregiver or the medical facility. It is important for you to follow up on all of your test results.  SEEK IMMEDIATE MEDICAL ATTENTION IF: You have more than a spotting of blood in your stool.  Your belly is swollen (abdominal distention).  You are nauseated or vomiting.  You have a temperature over 101.  You have abdominal pain or discomfort that is severe or gets worse throughout the day.   Your colonoscopy revealed 3 polyp(s) which I removed successfully. Await pathology results, my office will contact you. I recommend repeating colonoscopy in 5 years for surveillance purposes. Follow up with Gi in 3 months   I hope you have a great rest of your week!  Elon Alas. Abbey Chatters, D.O. Gastroenterology and Hepatology [facility]

## 2022-05-16 NOTE — Anesthesia Postprocedure Evaluation (Signed)
Anesthesia Post Note  Patient: Alexis Lucas  Procedure(s) Performed: COLONOSCOPY WITH PROPOFOL POLYPECTOMY  Patient location during evaluation: Phase II Anesthesia Type: General Level of consciousness: awake and alert and oriented Pain management: pain level controlled Vital Signs Assessment: post-procedure vital signs reviewed and stable Respiratory status: spontaneous breathing, nonlabored ventilation and respiratory function stable Cardiovascular status: blood pressure returned to baseline and stable Postop Assessment: no apparent nausea or vomiting Anesthetic complications: no  No notable events documented.   Last Vitals:  Vitals:   05/16/22 0819 05/16/22 0935  BP: (!) 131/59 (!) 146/63  Pulse: 60 67  Resp: 12 (!) 24  Temp: 36.8 C 36.5 C  SpO2: 98% 100%    Last Pain:  Vitals:   05/16/22 0935  TempSrc: Oral  PainSc: 0-No pain                 Lorin Hauck C Natali Lavallee

## 2022-05-16 NOTE — Anesthesia Preprocedure Evaluation (Signed)
Anesthesia Evaluation  Patient identified by MRN, date of birth, ID band Patient awake    Reviewed: Allergy & Precautions, H&P , NPO status , Patient's Chart, lab work & pertinent test results  History of Anesthesia Complications Negative for: history of anesthetic complications  Airway Mallampati: II  TM Distance: >3 FB Neck ROM: Full    Dental  (+) Edentulous Upper, Missing   Pulmonary asthma , Current SmokerPatient did not abstain from smoking.   Pulmonary exam normal breath sounds clear to auscultation       Cardiovascular negative cardio ROS Normal cardiovascular exam Rhythm:Regular Rate:Normal     Neuro/Psych  Headaches  Neuromuscular disease  negative psych ROS   GI/Hepatic ,GERD (doesn't take medications)  Controlled,,(+)     substance abuse  marijuana use  Endo/Other  negative endocrine ROS    Renal/GU negative Renal ROS  negative genitourinary   Musculoskeletal negative musculoskeletal ROS (+)    Abdominal   Peds negative pediatric ROS (+)  Hematology negative hematology ROS (+)   Anesthesia Other Findings   Reproductive/Obstetrics negative OB ROS                             Anesthesia Physical Anesthesia Plan  ASA: 2  Anesthesia Plan: General   Post-op Pain Management: Minimal or no pain anticipated   Induction: Intravenous  PONV Risk Score and Plan: Propofol infusion  Airway Management Planned: Nasal Cannula and Natural Airway  Additional Equipment:   Intra-op Plan:   Post-operative Plan:   Informed Consent: I have reviewed the patients History and Physical, chart, labs and discussed the procedure including the risks, benefits and alternatives for the proposed anesthesia with the patient or authorized representative who has indicated his/her understanding and acceptance.     Dental advisory given  Plan Discussed with: CRNA  Anesthesia Plan Comments:         Anesthesia Quick Evaluation

## 2022-05-16 NOTE — Interval H&P Note (Signed)
History and Physical Interval Note:  05/16/2022 9:07 AM  [name]  has presented today for surgery, with the diagnosis of Screening colon cancer.  The various methods of treatment have been discussed with the patient and family. After consideration of risks, benefits and other options for treatment, the patient has consented to  Procedure(s) with comments: COLONOSCOPY WITH PROPOFOL (N/A) - 9:15AM, ASA 2 as a surgical intervention.  The patient's history has been reviewed, patient examined, no change in status, stable for surgery.  I have reviewed the patient's chart and labs.  Questions were answered to the patient's satisfaction.     Alexis Lucas

## 2022-05-16 NOTE — Transfer of Care (Signed)
Immediate Anesthesia Transfer of Care Note  Patient: Alexis Lucas  Procedure(s) Performed: COLONOSCOPY WITH PROPOFOL POLYPECTOMY  Patient Location: Short Stay  Anesthesia Type:General  Level of Consciousness: awake, alert , oriented, and patient cooperative  Airway & Oxygen Therapy: Patient Spontanous Breathing  Post-op Assessment: Report given to RN, Post -op Vital signs reviewed and stable, and Patient moving all extremities  Post vital signs: Reviewed and stable  Last Vitals:  Vitals Value Taken Time  BP    Temp    Pulse    Resp    SpO2      Last Pain:  Vitals:   05/16/22 0915  TempSrc:   PainSc: 0-No pain      Patients Stated Pain Goal: 5 (02/07/47 6282)  Complications: No notable events documented.

## 2022-05-17 LAB — SURGICAL PATHOLOGY

## 2022-05-18 ENCOUNTER — Other Ambulatory Visit: Payer: Self-pay | Admitting: Gastroenterology

## 2022-05-23 ENCOUNTER — Encounter (HOSPITAL_COMMUNITY): Payer: Self-pay | Admitting: Internal Medicine

## 2022-08-14 NOTE — Progress Notes (Unsigned)
GI Office Note    Referring Provider: Mirna Mires, MD Primary Care Physician:  Mirna Mires, MD Primary Gastroenterologist: Hennie Duos. Marletta Lor, DO  Date:  08/15/2022  ID:  Tyson Dense, DOB 04/25/1960, MRN [id number]   Chief Complaint   Chief Complaint  Patient presents with   Follow-up    Doing well   History of Present Illness  Alexis Lucas is a 62 y.o. female with a history of breast cancer s/p surgery and radiation, chronic headaches, asthma, GERD presenting today for follow-up.  Initial office visit 04/25/22. 10lb weight loss in last year. Used ot dirnk ensure but unable to afford currently. Completing radiation. Recent negative CT C/A/P. Mild ttp to epigastrium. Having GERD breakthrough 3 times per week. Start on low dose PPI. Scheduled for colonoscopy. Advised to eat 4-6 smaller meals daily.   Labs 04/28/22: Lipase 39, CMP unremarkable other than mildly elevated glucose, normal CBC, negative UA  Colonoscopy 05/16/22: -non bleeding internal hemorrhoids -three 3-52mm polyps in the cecum -pathology revealed tubular adenomas -repeat colonoscopy in 5 years  Today:  Having more good days then bad. Appetite is improved since her last visit. Reflux is doing well.  No dysphagia  Reports history of constipation prior to colonoscopy and was told she was backed up and told to take laxatives. Having daily stools. No longer having significant fullness sensation after eating. No N/V. Did have a 24 hour bug last week and a little diarrhea but quickly improved.   No melena or brbrpr.   Would like a note to go back to work.    Wt Readings from Last 3 Encounters:  08/15/22 121 lb (54.9 kg)  05/16/22 115 lb (52.2 kg)  04/28/22 115 lb (52.2 kg)  04/25/22  115 lb   Current Outpatient Medications  Medication Sig Dispense Refill   albuterol (PROVENTIL) (2.5 MG/3ML) 0.083% nebulizer solution Take 3 mLs (2.5 mg total) by nebulization every 6 (six) hours as needed for wheezing or shortness  of breath. 75 mL 0   albuterol (VENTOLIN HFA) 108 (90 Base) MCG/ACT inhaler Inhale 2 puffs into the lungs as needed for wheezing or shortness of breath. 18 g 0   ibuprofen (ADVIL,MOTRIN) 800 MG tablet Take 800 mg by mouth as needed for mild pain or moderate pain.     letrozole (FEMARA) 2.5 MG tablet Take 2.5 mg by mouth daily.     loratadine (CLARITIN) 10 MG tablet Take by mouth.     pantoprazole (PROTONIX) 20 MG tablet TAKE 1 TABLET BY MOUTH EVERY DAY 90 tablet 1   tiotropium (SPIRIVA HANDIHALER) 18 MCG inhalation capsule INHALE THE CONTENTS OF 1 CAPSUL VIA HANDIHALER EVERY DAY     No current facility-administered medications for this visit.    Past Medical History:  Diagnosis Date   Asthma    Breast cancer (HCC)    Cervical radiculopathy    Chronic headaches    GERD (gastroesophageal reflux disease)    Neck pain    Ovarian tumor     Past Surgical History:  Procedure Laterality Date   ABDOMINAL HYSTERECTOMY     COLONOSCOPY WITH PROPOFOL N/A 05/16/2022   Procedure: COLONOSCOPY WITH PROPOFOL;  Surgeon: Lanelle Bal, DO;  Location: [facility];  Service: Endoscopy;  Laterality: N/A;  9:15AM, ASA 2   LEFT OOPHORECTOMY     POLYPECTOMY  05/16/2022   Procedure: POLYPECTOMY;  Surgeon: Lanelle Bal, DO;  Location: [facility];  Service: Endoscopy;;    Family History  Problem Relation Age of Onset   Colon cancer Neg Hx    Colon polyps Neg Hx     Allergies as of 08/15/2022 - Review Complete 08/15/2022  Allergen Reaction Noted   Penicillins Hives, Shortness Of Breath, Swelling, and Other (See Comments) 01/02/2011   Shellfish allergy Anaphylaxis, Hives, and Shortness Of Breath 06/18/2016    Social History   Socioeconomic History   Marital status: Legally Separated    Spouse name: Not on file   Number of children: Not on file   Years of education: Not on file   Highest education level: Not on file  Occupational History   Not on file  Tobacco Use   Smoking status:  Every Day    Packs/day: .5    Types: Cigarettes   Smokeless tobacco: Never  Substance and Sexual Activity   Alcohol use: No   Drug use: Yes    Types: Marijuana    Comment: last on Sunday   Sexual activity: Yes  Other Topics Concern   Not on file  Social History Narrative   Not on file   Social Determinants of Health   Financial Resource Strain: Not on file  Food Insecurity: Not on file  Transportation Needs: Not on file  Physical Activity: Not on file  Stress: Not on file  Social Connections: Not on file     Review of Systems   Gen: Denies fever, chills, anorexia. Denies fatigue, weakness, weight loss.  CV: Denies chest pain, palpitations, syncope, peripheral edema, and claudication. Resp: Denies dyspnea at rest, cough, wheezing, coughing up blood, and pleurisy. GI: See HPI Derm: Denies rash, itching, dry skin Psych: Denies depression, anxiety, memory loss, confusion. No homicidal or suicidal ideation.  Heme: Denies bruising, bleeding, and enlarged lymph nodes.   Physical Exam   BP 131/68 (BP Location: Right Arm, Patient Position: Sitting, Cuff Size: Normal)   Pulse 73   Temp 98.1 F (36.7 C) (Oral)   Ht 5\' 2"  (1.575 m)   Wt 121 lb (54.9 kg)   SpO2 96%   BMI 22.13 kg/m   General:   Alert and oriented. No distress noted. Pleasant and cooperative.  Head:  Normocephalic and atraumatic. Eyes:  Conjuctiva clear without scleral icterus. Mouth:  Oral mucosa pink and moist. Good dentition. No lesions. Lungs:  Clear to auscultation bilaterally. No wheezes, rales, or rhonchi. No distress.  Heart:  S1, S2 present without murmurs appreciated.  Abdomen:  +BS, soft, non-tender and non-distended. No rebound or guarding. No HSM or masses noted. Rectal: deferred Msk:  Symmetrical without gross deformities. Normal posture. Extremities:  Without edema. Neurologic:  Alert and  oriented x4 Psych:  Alert and cooperative. Normal mood and affect.   Assessment  Alexis Lucas is  a 62 y.o. female with a history of breast cancer s/p surgery and radiation, chronic headaches, asthma, GERD presenting today for follow-up.  GERD,Weight loss: Doing well on pantoprazole 20 mg once daily.  Denies any nausea, vomiting, lack of appetite, early satiety, abdominal pain.  Weight has improved since her last office visit.  Able to eat more things and has noticed her clothes fitting better.    Constipation: Noted previous issues with constipation.  She states she had a couple ED visits for abdominal pain prior to her colonoscopy and states that she was told she had a fair amount of stool and was recommended to take laxatives.  Since doing her prep for her colonoscopy she report significant improvement in her ability to eat  and she is having regular bowel movements without the need to take any laxatives currently.  Advised to use MiraLAX if she has a decreased frequency of bowel movements.  Colon polyps: Most recent colonoscopy with multiple tubular adenomas. Recommended repeat in 5 years.   PLAN   Continue pantoprazole 20 mg once daily. May use MiraLAX as needed if constipation reoccurs. Work note provided Follow up in 6 months   Brooke Bonito, MSN, FNP-BC, AGACNP-BC [facility]

## 2022-08-15 ENCOUNTER — Ambulatory Visit (INDEPENDENT_AMBULATORY_CARE_PROVIDER_SITE_OTHER): Payer: BC Managed Care – PPO | Admitting: Gastroenterology

## 2022-08-15 ENCOUNTER — Encounter: Payer: Self-pay | Admitting: Gastroenterology

## 2022-08-15 VITALS — BP 131/68 | HR 73 | Temp 98.1°F | Ht 62.0 in | Wt 121.0 lb

## 2022-08-15 DIAGNOSIS — R634 Abnormal weight loss: Secondary | ICD-10-CM | POA: Diagnosis not present

## 2022-08-15 DIAGNOSIS — Z86010 Personal history of colonic polyps: Secondary | ICD-10-CM

## 2022-08-15 DIAGNOSIS — K219 Gastro-esophageal reflux disease without esophagitis: Secondary | ICD-10-CM

## 2022-08-15 NOTE — Patient Instructions (Signed)
Continue pantoprazole 20 mg once daily.  Follow a GERD diet:  Avoid fried, fatty, greasy, spicy, citrus foods. Avoid caffeine and carbonated beverages. Avoid chocolate. Try eating 4-6 small meals a day rather than 3 large meals. Do not eat within 3 hours of laying down. Prop head of bed up on wood or bricks to create a 6 inch incline.   Please notify me if you have any appetite changes or begin losing weight again.  Began having issues with constipation you may take MiraLAX as needed.  It was a pleasure to see you today. I want to create trusting relationships with patients. If you receive a survey regarding your visit,  I greatly appreciate you taking time to fill this out on paper or through your MyChart. I value your feedback.  Brooke Bonito, MSN, FNP-BC, AGACNP-BC [facility]

## 2022-09-26 ENCOUNTER — Emergency Department (HOSPITAL_COMMUNITY)
Admission: EM | Admit: 2022-09-26 | Discharge: 2022-09-26 | Disposition: A | Discharge status: Home / Self Care | Payer: BC Managed Care – PPO | Attending: Emergency Medicine | Admitting: Emergency Medicine

## 2022-09-26 ENCOUNTER — Encounter (HOSPITAL_COMMUNITY): Payer: Self-pay

## 2022-09-26 DIAGNOSIS — E860 Dehydration: Secondary | ICD-10-CM | POA: Insufficient documentation

## 2022-09-26 DIAGNOSIS — R55 Syncope and collapse: Secondary | ICD-10-CM | POA: Insufficient documentation

## 2022-09-26 DIAGNOSIS — E876 Hypokalemia: Secondary | ICD-10-CM | POA: Diagnosis not present

## 2022-09-26 LAB — CBC WITH DIFFERENTIAL/PLATELET
Abs Immature Granulocytes: 0 10*3/uL (ref 0.00–0.07)
Band Neutrophils: 1 %
Basophils Absolute: 0.1 10*3/uL (ref 0.0–0.1)
Basophils Relative: 1 %
Eosinophils Absolute: 0.2 10*3/uL (ref 0.0–0.5)
Eosinophils Relative: 3 %
HCT: 34.7 % — ABNORMAL LOW (ref 36.0–46.0)
Hemoglobin: 11.4 g/dL — ABNORMAL LOW (ref 12.0–15.0)
Lymphocytes Relative: 50 %
Lymphs Abs: 2.9 10*3/uL (ref 0.7–4.0)
MCH: 30.4 pg (ref 26.0–34.0)
MCHC: 32.9 g/dL (ref 30.0–36.0)
MCV: 92.5 fL (ref 80.0–100.0)
Monocytes Absolute: 0.3 10*3/uL (ref 0.1–1.0)
Monocytes Relative: 6 %
Neutro Abs: 2.3 10*3/uL (ref 1.7–7.7)
Neutrophils Relative %: 39 %
Platelets: 140 10*3/uL — ABNORMAL LOW (ref 150–400)
RBC: 3.75 MIL/uL — ABNORMAL LOW (ref 3.87–5.11)
RDW: 13.6 % (ref 11.5–15.5)
WBC: 5.8 10*3/uL (ref 4.0–10.5)
nRBC: 0 % (ref 0.0–0.2)

## 2022-09-26 LAB — BASIC METABOLIC PANEL
Anion gap: 6 (ref 5–15)
BUN: 13 mg/dL (ref 6–20)
CO2: 23 mmol/L (ref 22–32)
Calcium: 8.3 mg/dL — ABNORMAL LOW (ref 8.9–10.3)
Chloride: 107 mmol/L (ref 98–111)
Creatinine, Ser: 0.8 mg/dL (ref 0.44–1.00)
GFR, Estimated: >60 mL/min (ref >60)
Glucose, Bld: 101 mg/dL — ABNORMAL HIGH (ref 70–99)
Potassium: 3.4 mmol/L — ABNORMAL LOW (ref 3.5–5.1)
Sodium: 136 mmol/L (ref 135–145)

## 2022-09-26 LAB — MAGNESIUM: Magnesium: 1.8 mg/dL (ref 1.7–2.4)

## 2022-09-26 MED ORDER — POTASSIUM CHLORIDE CRYS ER 20 MEQ PO TBCR
20.0000 meq | EXTENDED_RELEASE_TABLET | Freq: Once | ORAL | Status: AC
  Administered 2022-09-26: 20 meq via ORAL
  Filled 2022-09-26: qty 1

## 2022-09-26 MED ORDER — SODIUM CHLORIDE 0.9 % IV BOLUS
1000.0000 mL | Freq: Once | INTRAVENOUS | Status: AC
  Administered 2022-09-26: 1000 mL via INTRAVENOUS

## 2022-09-26 NOTE — ED Provider Notes (Signed)
[place] EMERGENCY DEPARTMENT AT [facility] Provider Note   CSN: [id number] Arrival date & time: 09/26/22  1833     History  Chief Complaint  Patient presents with   Loss of Consciousness    Alexis Lucas is a 62 y.o. female.  He is brought in by EMS after near syncopal or syncopal event.  She said she has been up since 3 AM, having worked this morning and then went to a funeral and has been outside all day.  Has not eaten or drank much.  She said she felt very lightheaded and might of blacked out for a second.  EMS found blood pressure to initially be low.  Patient denies any fall or injury.  No chest pain headache numbness weakness.  Does feel little swimmy headed and thirsty.  The history is provided by the patient.  Loss of Consciousness Episode history:  Single Most recent episode:  Today Progression:  Resolved Chronicity:  New Context: normal activity   Witnessed: yes   Relieved by:  Lying down Associated symptoms: dizziness   Associated symptoms: no chest pain, no difficulty breathing, no fever, no focal weakness, no headaches, no nausea, no shortness of breath and no vomiting        Home Medications Prior to Admission medications   Medication Sig Start Date End Date Taking? Authorizing Provider  albuterol (PROVENTIL) (2.5 MG/3ML) 0.083% nebulizer solution Take 3 mLs (2.5 mg total) by nebulization every 6 (six) hours as needed for wheezing or shortness of breath. 12/19/21   Particia Nearing, PA-C  albuterol (VENTOLIN HFA) 108 (90 Base) MCG/ACT inhaler Inhale 2 puffs into the lungs as needed for wheezing or shortness of breath. 12/19/21   Particia Nearing, PA-C  ibuprofen (ADVIL,MOTRIN) 800 MG tablet Take 800 mg by mouth as needed for mild pain or moderate pain.    [provider]  letrozole (FEMARA) 2.5 MG tablet Take 2.5 mg by mouth daily. 05/11/21   [provider]  loratadine (CLARITIN) 10 MG tablet Take by mouth.    [provider]  pantoprazole (PROTONIX) 20 MG tablet TAKE 1 TABLET BY MOUTH EVERY DAY 05/20/22   Aida Raider, NP  tiotropium (SPIRIVA HANDIHALER) 18 MCG inhalation capsule INHALE THE CONTENTS OF 1 CAPSUL VIA HANDIHALER EVERY DAY 02/08/19   [provider]      Allergies    Penicillins and Shellfish allergy    Review of Systems   Review of Systems  Constitutional:  Negative for fever.  Eyes:  Negative for visual disturbance.  Respiratory:  Negative for shortness of breath.   Cardiovascular:  Positive for syncope. Negative for chest pain.  Gastrointestinal:  Negative for nausea and vomiting.  Neurological:  Positive for dizziness, syncope and light-headedness. Negative for focal weakness and headaches.    Physical Exam Updated Vital Signs BP (!) 116/59 (BP Location: Left Arm)   Pulse 72   Temp 97.8 F (36.6 C) (Oral)   Resp 16   SpO2 98%  Physical Exam Vitals and nursing note reviewed.  Constitutional:      General: She is not in acute distress.    Appearance: Normal appearance. She is well-developed.  HENT:     Head: Normocephalic and atraumatic.  Eyes:     Conjunctiva/sclera: Conjunctivae normal.  Cardiovascular:     Rate and Rhythm: Normal rate and regular rhythm.     Heart sounds: No murmur heard. Pulmonary:     Effort: Pulmonary effort is normal.  No respiratory distress.     Breath sounds: Normal breath sounds.  Abdominal:     Palpations: Abdomen is soft.     Tenderness: There is no abdominal tenderness. There is no guarding or rebound.  Musculoskeletal:        General: No deformity. Normal range of motion.     Cervical back: Neck supple.  Skin:    General: Skin is warm and dry.     Capillary Refill: Capillary refill takes less than 2 seconds.  Neurological:     General: No focal deficit present.     Mental Status: She is alert and oriented to person, place, and time.     Cranial Nerves: No cranial nerve deficit.     Sensory: No sensory deficit.      Motor: No weakness.     ED Results / Procedures / Treatments   Labs (all labs ordered are listed, but only abnormal results are displayed) Labs Reviewed  BASIC METABOLIC PANEL - Abnormal; Notable for the following components:      Result Value   Potassium 3.4 (*)    Glucose, Bld 101 (*)    Calcium 8.3 (*)    All other components within normal limits  CBC WITH DIFFERENTIAL/PLATELET - Abnormal; Notable for the following components:   RBC 3.75 (*)    Hemoglobin 11.4 (*)    HCT 34.7 (*)    Platelets 140 (*)    All other components within normal limits  MAGNESIUM    EKG EKG Interpretation  Date/Time:  Thursday September 26 2022 18:50:43 EDT Ventricular Rate:  58 PR Interval:  186 QRS Duration: 78 QT Interval:  405 QTC Calculation: 398 R Axis:   17 Text Interpretation: Duplicate Confirmed by Susy Frizzle [phone]) on 09/27/2022 8:33:16 AM  Radiology No results found.  Procedures Procedures    Medications Ordered in ED Medications  sodium chloride 0.9 % bolus 1,000 mL (has no administration in time range)    ED Course/ Medical Decision Making/ A&P                             Medical Decision Making Amount and/or Complexity of Data Reviewed Labs: ordered.  Risk Prescription drug management.   This patient complains of near syncope syncope dehydration; this involves an extensive number of treatment Options and is a complaint that carries with it a high risk of complications and morbidity. The differential includes dehydration, hypovolemia, vasovagal, arrhythmia, metabolic derangement  I ordered, reviewed and interpreted labs, which included CBC with normal white count low stable hemoglobin, chemistries with mildly low potassium I ordered medication IV fluids oral potassium and reviewed PMP when indicated. Additional history obtained from patient's family member Previous records obtained and reviewed in epic no recent admissions Cardiac monitoring reviewed,  normal sinus rhythm Social determinants considered, tobacco use Critical Interventions: None  After the interventions stated above, I reevaluated the patient and found patient to be tired appearing although hemodynamically stable and otherwise asymptomatic Admission and further testing considered, no indications for admission or further workup at this time.  Recommended patient home with continued hydration and close PCP follow-up.  Return instructions discussed         Final Clinical Impression(s) / ED Diagnoses Final diagnoses:  Near syncope  Dehydration  Hypokalemia    Rx / DC Orders ED Discharge Orders     None         Terrilee Files, MD 09/27/22  1030  

## 2022-09-26 NOTE — Discharge Instructions (Signed)
You were seen in the emergency department for a near fainting spell.  Your blood pressure was low but improved with some fluids.  Please keep well-hydrated and rest.  Follow-up with your regular doctor.  Return to the emergency department if any worsening or concerning symptoms.

## 2022-09-26 NOTE — ED Triage Notes (Signed)
[facility] reports pt coming from home for syncopal episode. Pt has been outside all day since 1100 and has not eaten or drank all day. When pt passed out she slowly lowered herself down and did not hit her head. BP 90/p.

## 2022-10-07 DIAGNOSIS — J432 Centrilobular emphysema: Secondary | ICD-10-CM | POA: Diagnosis not present

## 2022-10-07 DIAGNOSIS — E876 Hypokalemia: Secondary | ICD-10-CM | POA: Diagnosis not present

## 2022-10-07 DIAGNOSIS — T671XXA Heat syncope, initial encounter: Secondary | ICD-10-CM | POA: Diagnosis not present

## 2022-10-07 DIAGNOSIS — I700 Atherosclerosis of aorta: Secondary | ICD-10-CM | POA: Diagnosis not present

## 2022-10-27 ENCOUNTER — Other Ambulatory Visit: Payer: Self-pay | Admitting: Family Medicine

## 2022-10-27 ENCOUNTER — Other Ambulatory Visit: Payer: Self-pay | Admitting: Gastroenterology

## 2022-10-28 NOTE — Telephone Encounter (Signed)
Requested Prescriptions  Refused Prescriptions Disp Refills   VENTOLIN HFA 108 (90 Base) MCG/ACT inhaler [Pharmacy Med Name: VENTOLIN HFA 90 MCG INHALER] 18 each     Sig: INHALE 2 PUFFS INTO THE LUNGS AS NEEDED FOR WHEEZING OR SHORTNESS OF BREATH.     There is no refill protocol information for this order

## 2022-11-05 ENCOUNTER — Telehealth: Payer: Self-pay

## 2022-11-05 DIAGNOSIS — R55 Syncope and collapse: Secondary | ICD-10-CM

## 2022-11-05 NOTE — Telephone Encounter (Signed)
Received fax from Dr.hill for 3 day monitor for syncope  Order entered for [facility], will be shipped to patients home.

## 2022-11-20 DIAGNOSIS — Z5181 Encounter for therapeutic drug level monitoring: Secondary | ICD-10-CM | POA: Diagnosis not present

## 2022-11-20 DIAGNOSIS — C50412 Malignant neoplasm of upper-outer quadrant of left female breast: Secondary | ICD-10-CM | POA: Diagnosis not present

## 2022-11-20 DIAGNOSIS — Z170 Estrogen receptor positive status [ER+]: Secondary | ICD-10-CM | POA: Diagnosis not present

## 2022-11-20 DIAGNOSIS — F1290 Cannabis use, unspecified, uncomplicated: Secondary | ICD-10-CM | POA: Diagnosis not present

## 2022-11-20 DIAGNOSIS — F17200 Nicotine dependence, unspecified, uncomplicated: Secondary | ICD-10-CM | POA: Diagnosis not present

## 2022-11-20 DIAGNOSIS — C50912 Malignant neoplasm of unspecified site of left female breast: Secondary | ICD-10-CM | POA: Diagnosis not present

## 2022-11-20 DIAGNOSIS — Z79811 Long term (current) use of aromatase inhibitors: Secondary | ICD-10-CM | POA: Diagnosis not present

## 2022-12-05 ENCOUNTER — Encounter: Payer: Self-pay | Admitting: Emergency Medicine

## 2022-12-05 ENCOUNTER — Ambulatory Visit
Admission: EM | Admit: 2022-12-05 | Discharge: 2022-12-05 | Disposition: A | Discharge status: Home / Self Care | Payer: BC Managed Care – PPO | Attending: Nurse Practitioner | Admitting: Nurse Practitioner

## 2022-12-05 DIAGNOSIS — J069 Acute upper respiratory infection, unspecified: Secondary | ICD-10-CM | POA: Diagnosis not present

## 2022-12-05 DIAGNOSIS — J4520 Mild intermittent asthma, uncomplicated: Secondary | ICD-10-CM | POA: Insufficient documentation

## 2022-12-05 DIAGNOSIS — Z20822 Contact with and (suspected) exposure to covid-19: Secondary | ICD-10-CM | POA: Diagnosis not present

## 2022-12-05 MED ORDER — PROMETHAZINE-DM 6.25-15 MG/5ML PO SYRP: 5.0000 mL | ORAL_SOLUTION | Freq: Every evening | ORAL | 0 refills | Status: AC | PRN

## 2022-12-05 MED ORDER — ALBUTEROL SULFATE HFA 108 (90 BASE) MCG/ACT IN AERS: 2.0000 | INHALATION_SPRAY | RESPIRATORY_TRACT | 0 refills | Status: AC | PRN

## 2022-12-05 MED ORDER — BENZONATATE 100 MG PO CAPS: 100.0000 mg | ORAL_CAPSULE | Freq: Three times a day (TID) | ORAL | 0 refills | Status: AC | PRN

## 2022-12-05 NOTE — ED Triage Notes (Signed)
Fever, sneezing, runny nose, since Tuesday.  Has lost sense of taste and smell.  C/o headache

## 2022-12-05 NOTE — Discharge Instructions (Addendum)
You have a viral upper respiratory infection.  Symptoms should improve over the next week to 10 days.  If you develop chest pain or shortness of breath, go to the emergency room.  We have tested you today for COVID-19.  You will see the results in Mychart and we will call you with positive results.  You are good candidate for antiviral medicine if you test positive.  We will prescribe it for you with positive tomorrow.  I have sent a refill of the albuterol inhaler to the pharmacy for you to continue taking as needed for wheezing or chest tightness.  Some things that can make you feel better are: - Increased rest - Increasing fluid with water/sugar free electrolytes - Acetaminophen and ibuprofen as needed for fever/pain - Salt water gargling, chloraseptic spray and throat lozenges - OTC guaifenesin (Mucinex) 600 mg twice daily - Saline sinus flushes or a neti pot - Humidifying the air -Tessalon Perles every 8 hours as needed for dry cough and cough syrup at night time as needed for cough

## 2022-12-05 NOTE — ED Provider Notes (Signed)
[facility]    CSN: [id number] Arrival date & time: 12/05/22  1021      History   Chief Complaint No chief complaint on file.   HPI Alexis Lucas is a 62 y.o. female.   Patient presents today with 4-day history of fever, body aches and chills, congested cough, chest tightness and chest congestion.  She is using rescue inhaler more frequently than normal which does help with the chest congestion and shortness of breath.  Also has runny/stuffy nose, sneezing, headache, diarrhea, loss of taste and smell, decreased appetite, and fatigue.  No known sick contacts.  Has been taking Robitussin and NyQuil for symptoms without improvement.  Reports history of asthma.    Past Medical History:  Diagnosis Date   Asthma    Breast cancer (HCC)    Cervical radiculopathy    Chronic headaches    GERD (gastroesophageal reflux disease)    Neck pain    Ovarian tumor     There are no problems to display for this patient.   Past Surgical History:  Procedure Laterality Date   ABDOMINAL HYSTERECTOMY     COLONOSCOPY WITH PROPOFOL N/A 05/16/2022   Procedure: COLONOSCOPY WITH PROPOFOL;  Surgeon: Lanelle Bal, DO;  Location: [facility];  Service: Endoscopy;  Laterality: N/A;  9:15AM, ASA 2   LEFT OOPHORECTOMY     POLYPECTOMY  05/16/2022   Procedure: POLYPECTOMY;  Surgeon: Lanelle Bal, DO;  Location: [facility];  Service: Endoscopy;;    OB History   No obstetric history on file.      Home Medications    Prior to Admission medications   Medication Sig Start Date End Date Taking? Authorizing Provider  benzonatate (TESSALON) 100 MG capsule Take 1 capsule (100 mg total) by mouth 3 (three) times daily as needed for cough. Do not take with alcohol or while driving or operating heavy machinery.  May cause drowsiness. 12/05/22  Yes Valentino Nose, NP  promethazine-dextromethorphan (PROMETHAZINE-DM) 6.25-15 MG/5ML syrup Take 5 mLs by mouth at bedtime as needed for  cough. Do not take with alcohol or while driving or operating heavy machinery.  May cause drowsiness. 12/05/22  Yes Cathlean Marseilles A, NP  albuterol (PROVENTIL) (2.5 MG/3ML) 0.083% nebulizer solution Take 3 mLs (2.5 mg total) by nebulization every 6 (six) hours as needed for wheezing or shortness of breath. 12/19/21   Particia Nearing, PA-C  albuterol (VENTOLIN HFA) 108 (90 Base) MCG/ACT inhaler Inhale 2 puffs into the lungs as needed for wheezing or shortness of breath. 12/05/22   Valentino Nose, NP  ibuprofen (ADVIL,MOTRIN) 800 MG tablet Take 800 mg by mouth as needed for mild pain or moderate pain.    [provider]  letrozole (FEMARA) 2.5 MG tablet Take 2.5 mg by mouth daily. 05/11/21   [provider]  loratadine (CLARITIN) 10 MG tablet Take by mouth.    [provider]  pantoprazole (PROTONIX) 20 MG tablet TAKE 1 TABLET BY MOUTH EVERY DAY 10/28/22   Aida Raider, NP    Family History Family History  Problem Relation Age of Onset   Colon cancer Neg Hx    Colon polyps Neg Hx     Social History Social History   Tobacco Use   Smoking status: Every Day    Current packs/day: 0.50    Types: Cigarettes   Smokeless tobacco: Never  Substance Use Topics   Alcohol use: No   Drug use: Yes  Types: Marijuana    Comment: last on Sunday     Allergies   Penicillins and Shellfish allergy   Review of Systems Review of Systems Per HPI  Physical Exam Triage Vital Signs ED Triage Vitals [12/05/22 1120]  Encounter Vitals Group     BP (!) 180/78     Systolic BP Percentile      Diastolic BP Percentile      Pulse Rate 75     Resp 18     Temp 98.5 F (36.9 C)     Temp Source Oral     SpO2 98 %     Weight      Height      Head Circumference      Peak Flow      Pain Score 10     Pain Loc      Pain Education      Exclude from Growth Chart    No data found.  Updated Vital Signs BP (!) 180/78 (BP Location: Right Arm)   Pulse 75   Temp  98.5 F (36.9 C) (Oral)   Resp 18   SpO2 98%   Visual Acuity Right Eye Distance:   Left Eye Distance:   Bilateral Distance:    Right Eye Near:   Left Eye Near:    Bilateral Near:     Physical Exam Vitals and nursing note reviewed.  Constitutional:      General: She is not in acute distress.    Appearance: Normal appearance. She is not ill-appearing or toxic-appearing.  HENT:     Head: Normocephalic and atraumatic.     Right Ear: Tympanic membrane, ear canal and external ear normal.     Left Ear: Tympanic membrane, ear canal and external ear normal.     Nose: Congestion present. No rhinorrhea.     Mouth/Throat:     Mouth: Mucous membranes are moist.     Pharynx: Oropharynx is clear. No oropharyngeal exudate or posterior oropharyngeal erythema.  Eyes:     General: No scleral icterus.    Extraocular Movements: Extraocular movements intact.  Cardiovascular:     Rate and Rhythm: Normal rate and regular rhythm.  Pulmonary:     Effort: Pulmonary effort is normal. No respiratory distress.     Breath sounds: Normal breath sounds. No wheezing, rhonchi or rales.  Abdominal:     General: Abdomen is flat. Bowel sounds are normal. There is no distension.     Palpations: Abdomen is soft.  Musculoskeletal:     Cervical back: Normal range of motion and neck supple.  Lymphadenopathy:     Cervical: No cervical adenopathy.  Skin:    General: Skin is warm and dry.     Coloration: Skin is not jaundiced or pale.     Findings: No erythema or rash.  Neurological:     Mental Status: She is alert and oriented to person, place, and time.  Psychiatric:        Behavior: Behavior is cooperative.      UC Treatments / Results  Labs (all labs ordered are listed, but only abnormal results are displayed) Labs Reviewed  SARS CORONAVIRUS 2 (TAT 6-24 HRS)    EKG   Radiology No results found.  Procedures Procedures (including critical care time)  Medications Ordered in UC Medications -  No data to display  Initial Impression / Assessment and Plan / UC Course  I have reviewed the triage vital signs and the nursing notes.  Pertinent labs &  imaging results that were available during my care of the patient were reviewed by me and considered in my medical decision making (see chart for details).   Patient is well-appearing, afebrile, not tachycardic, not tachypneic, oxygenating well on room air.  Patient is mildly hypertensive in urgent care today.  1. Exposure to COVID-19 virus 2. Viral URI with cough Suspect viral etiology Vitals and examination today are reassuring  COVID-19 testing obtained Patient is a candidate for Paxlovid if she tests positive; recent GFR 2 months ago greater than 60 Other supportive care discussed with patient Start cough suppressant medication ER and return precautions discussed  3. Mild intermittent asthma without complication Refill given for albuterol inhaler to use as needed for wheezing, chest tightness, shortness of breath  The patient was given the opportunity to ask questions.  All questions answered to their satisfaction.  The patient is in agreement to this plan.    Final Clinical Impressions(s) / UC Diagnoses   Final diagnoses:  Exposure to COVID-19 virus  Viral URI with cough  Mild intermittent asthma without complication     Discharge Instructions      You have a viral upper respiratory infection.  Symptoms should improve over the next week to 10 days.  If you develop chest pain or shortness of breath, go to the emergency room.  We have tested you today for COVID-19.  You will see the results in Mychart and we will call you with positive results.  You are good candidate for antiviral medicine if you test positive.  We will prescribe it for you with positive tomorrow.  I have sent a refill of the albuterol inhaler to the pharmacy for you to continue taking as needed for wheezing or chest tightness.  Some things that can make  you feel better are: - Increased rest - Increasing fluid with water/sugar free electrolytes - Acetaminophen and ibuprofen as needed for fever/pain - Salt water gargling, chloraseptic spray and throat lozenges - OTC guaifenesin (Mucinex) 600 mg twice daily - Saline sinus flushes or a neti pot - Humidifying the air -Tessalon Perles every 8 hours as needed for dry cough and cough syrup at night time as needed for cough     ED Prescriptions     Medication Sig Dispense Auth. Provider   benzonatate (TESSALON) 100 MG capsule Take 1 capsule (100 mg total) by mouth 3 (three) times daily as needed for cough. Do not take with alcohol or while driving or operating heavy machinery.  May cause drowsiness. 21 capsule Cathlean Marseilles A, NP   promethazine-dextromethorphan (PROMETHAZINE-DM) 6.25-15 MG/5ML syrup Take 5 mLs by mouth at bedtime as needed for cough. Do not take with alcohol or while driving or operating heavy machinery.  May cause drowsiness. 118 mL Cathlean Marseilles A, NP   albuterol (VENTOLIN HFA) 108 (90 Base) MCG/ACT inhaler Inhale 2 puffs into the lungs as needed for wheezing or shortness of breath. 18 g Valentino Nose, NP      PDMP not reviewed this encounter.   Valentino Nose, NP 12/05/22 [phone]

## 2022-12-06 LAB — SARS CORONAVIRUS 2 (TAT 6-24 HRS): SARS Coronavirus 2: NEGATIVE

## 2022-12-30 ENCOUNTER — Other Ambulatory Visit: Payer: Self-pay | Admitting: Nurse Practitioner

## 2022-12-31 NOTE — Telephone Encounter (Signed)
Requested by interface surescripts. Provider not at this practice.  Requested Prescriptions  Refused Prescriptions Disp Refills   VENTOLIN HFA 108 (90 Base) MCG/ACT inhaler [Pharmacy Med Name: VENTOLIN HFA 90 MCG INHALER] 18 each     Sig: INHALE 2 PUFFS INTO THE LUNGS AS NEEDED FOR WHEEZING OR SHORTNESS OF BREATH.     Pulmonology:  Beta Agonists 2 Failed - 12/30/2022  1:53 AM      Failed - Last BP in normal range    BP Readings from Last 1 Encounters:  12/05/22 (!) 180/78         Failed - Valid encounter within last 12 months    Recent Outpatient Visits   None            Passed - Last Heart Rate in normal range    Pulse Readings from Last 1 Encounters:  12/05/22 75

## 2023-02-10 ENCOUNTER — Encounter: Payer: Self-pay | Admitting: Gastroenterology

## 2023-02-19 DIAGNOSIS — R928 Other abnormal and inconclusive findings on diagnostic imaging of breast: Secondary | ICD-10-CM | POA: Diagnosis not present

## 2023-02-19 DIAGNOSIS — Z853 Personal history of malignant neoplasm of breast: Secondary | ICD-10-CM | POA: Diagnosis not present

## 2023-02-19 DIAGNOSIS — R92323 Mammographic fibroglandular density, bilateral breasts: Secondary | ICD-10-CM | POA: Diagnosis not present

## 2023-02-23 ENCOUNTER — Encounter (HOSPITAL_COMMUNITY): Payer: Self-pay

## 2023-02-23 ENCOUNTER — Emergency Department (HOSPITAL_COMMUNITY): Payer: MEDICAID

## 2023-02-23 ENCOUNTER — Other Ambulatory Visit: Payer: Self-pay

## 2023-02-23 ENCOUNTER — Emergency Department (HOSPITAL_COMMUNITY)
Admission: EM | Admit: 2023-02-23 | Discharge: 2023-02-23 | Disposition: A | Discharge status: Home / Self Care | Payer: MEDICAID | Attending: Emergency Medicine | Admitting: Emergency Medicine

## 2023-02-23 DIAGNOSIS — R350 Frequency of micturition: Secondary | ICD-10-CM | POA: Diagnosis not present

## 2023-02-23 DIAGNOSIS — R109 Unspecified abdominal pain: Secondary | ICD-10-CM

## 2023-02-23 DIAGNOSIS — R1031 Right lower quadrant pain: Secondary | ICD-10-CM | POA: Insufficient documentation

## 2023-02-23 LAB — URINALYSIS, ROUTINE W REFLEX MICROSCOPIC
Bilirubin Urine: NEGATIVE
Glucose, UA: NEGATIVE mg/dL
Hgb urine dipstick: NEGATIVE
Ketones, ur: NEGATIVE mg/dL
Leukocytes,Ua: NEGATIVE
Nitrite: NEGATIVE
Protein, ur: NEGATIVE mg/dL
Specific Gravity, Urine: 1.006 (ref 1.005–1.030)
pH: 6 (ref 5.0–8.0)

## 2023-02-23 MED ORDER — ACETAMINOPHEN 500 MG PO TABS
1000.0000 mg | ORAL_TABLET | Freq: Once | ORAL | Status: AC
  Administered 2023-02-23: 1000 mg via ORAL
  Filled 2023-02-23: qty 2

## 2023-02-23 NOTE — ED Triage Notes (Signed)
RIGHt flank pain  Radiates around to the front Urinary frequency Pee at least 3 times overnight Denies odors or discoloration

## 2023-02-23 NOTE — ED Provider Notes (Signed)
[place] EMERGENCY DEPARTMENT AT [facility] Provider Note   CSN: [id number] Arrival date & time: 02/23/23  1138     History  Chief Complaint  Patient presents with   Flank Pain   HPI Alexis Lucas is a 62 y.o. female with history of ovarian tumor, breast cancer, GERD presenting for right flank pain that radiates down into the groin.  Also endorsing urinary frequency.  States she did pee 3 times last night.  Denies malodorous urine or hematuria.  States the pain can sometimes be in her lower pelvis.  She does deny abnormal vaginal bleeding or discharge.  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  Denies nausea vomiting diarrhea.  Took some ibuprofen before arrival which helped somewhat.   Flank Pain       Home Medications Prior to Admission medications   Medication Sig Start Date End Date Taking? Authorizing Provider  albuterol (PROVENTIL) (2.5 MG/3ML) 0.083% nebulizer solution Take 3 mLs (2.5 mg total) by nebulization every 6 (six) hours as needed for wheezing or shortness of breath. 12/19/21   Particia Nearing, PA-C  albuterol (VENTOLIN HFA) 108 (90 Base) MCG/ACT inhaler Inhale 2 puffs into the lungs as needed for wheezing or shortness of breath. 12/05/22   Valentino Nose, NP  benzonatate (TESSALON) 100 MG capsule Take 1 capsule (100 mg total) by mouth 3 (three) times daily as needed for cough. Do not take with alcohol or while driving or operating heavy machinery.  May cause drowsiness. 12/05/22   Valentino Nose, NP  ibuprofen (ADVIL,MOTRIN) 800 MG tablet Take 800 mg by mouth as needed for mild pain or moderate pain.    [provider]  letrozole (FEMARA) 2.5 MG tablet Take 2.5 mg by mouth daily. 05/11/21   [provider]  loratadine (CLARITIN) 10 MG tablet Take by mouth.    [provider]  pantoprazole (PROTONIX) 20 MG tablet TAKE 1 TABLET BY MOUTH EVERY DAY 10/28/22   Aida Raider, NP   promethazine-dextromethorphan (PROMETHAZINE-DM) 6.25-15 MG/5ML syrup Take 5 mLs by mouth at bedtime as needed for cough. Do not take with alcohol or while driving or operating heavy machinery.  May cause drowsiness. 12/05/22   Valentino Nose, NP      Allergies    Penicillins and Shellfish allergy    Review of Systems   Review of Systems  Genitourinary:  Positive for flank pain.    Physical Exam Updated Vital Signs BP (!) 138/49 (BP Location: Left Arm)   Pulse (!) 52   Temp 99.3 F (37.4 C) (Oral)   Resp 16   Ht 5\' 2"  (1.575 m)   Wt 52.6 kg   SpO2 99%   BMI 21.22 kg/m  Physical Exam Vitals and nursing note reviewed.  HENT:     Head: Normocephalic and atraumatic.     Mouth/Throat:     Mouth: Mucous membranes are moist.  Eyes:     General:        Right eye: No discharge.        Left eye: No discharge.     Conjunctiva/sclera: Conjunctivae normal.  Cardiovascular:     Rate and Rhythm: Normal rate and regular rhythm.     Pulses: Normal pulses.     Heart sounds: Normal heart sounds.  Pulmonary:     Effort: Pulmonary effort is normal.     Breath sounds: Normal breath sounds.  Abdominal:  General: Abdomen is flat.     Palpations: Abdomen is soft.     Tenderness: There is no abdominal tenderness. There is no right CVA tenderness or left CVA tenderness.    Skin:    General: Skin is warm and dry.  Neurological:     General: No focal deficit present.  Psychiatric:        Mood and Affect: Mood normal.     ED Results / Procedures / Treatments   Labs (all labs ordered are listed, but only abnormal results are displayed) Labs Reviewed  URINALYSIS, ROUTINE W REFLEX MICROSCOPIC - Abnormal; Notable for the following components:      Result Value   Color, Urine STRAW (*)    All other components within normal limits    EKG None  Radiology CT Renal Stone Study  Result Date: 02/23/2023 CLINICAL DATA:  Right flank pain radiates to the front. Urinary frequency.  EXAM: CT ABDOMEN AND PELVIS WITHOUT CONTRAST TECHNIQUE: Multidetector CT imaging of the abdomen and pelvis was performed following the standard protocol without IV contrast. RADIATION DOSE REDUCTION: This exam was performed according to the departmental dose-optimization program which includes automated exposure control, adjustment of the mA and/or kV according to patient size and/or use of iterative reconstruction technique. COMPARISON:  04/28/2022 FINDINGS: Lower chest: Dependent atelectasis. Hepatobiliary: No suspicious focal abnormality in the liver on this study without intravenous contrast. There is no evidence for gallstones, gallbladder wall thickening, or pericholecystic fluid. No intrahepatic or extrahepatic biliary dilation. Pancreas: No focal mass lesion. No dilatation of the main duct. No intraparenchymal cyst. No peripancreatic edema. Spleen: No splenomegaly. No suspicious focal mass lesion. Adrenals/Urinary Tract: No adrenal nodule or mass. Right kidney unremarkable. Specifically, no evidence for right renal or ureteral stone. No secondary changes in the right kidney or ureter. Left pelvic kidney again noted. No hydroureter. The urinary bladder appears normal for the degree of distention. Stomach/Bowel: Stomach is unremarkable. No gastric wall thickening. No evidence of outlet obstruction. No small bowel wall thickening. No small bowel dilatation. The terminal ileum is normal. The appendix is normal. No gross colonic mass. No colonic wall thickening. Vascular/Lymphatic: There is moderate atherosclerotic calcification of the abdominal aorta without aneurysm. There is no gastrohepatic or hepatoduodenal ligament lymphadenopathy. No retroperitoneal or mesenteric lymphadenopathy. No pelvic sidewall lymphadenopathy. Reproductive: There is no adnexal mass. Other: No intraperitoneal free fluid. Musculoskeletal: No worrisome lytic or sclerotic osseous abnormality. IMPRESSION: 1. No acute findings in the  abdomen or pelvis. Specifically, no findings to explain the patient's history of right flank pain. No secondary changes in the right kidney or ureter. 2. Left pelvic kidney again noted. No hydronephrosis or hydroureter. 3.  Aortic Atherosclerosis (ICD10-I70.0). Electronically Signed   By: Kennith Center M.D.   On: 02/23/2023 12:49    Procedures Procedures    Medications Ordered in ED Medications - No data to display  ED Course/ Medical Decision Making/ A&P Clinical Course as of 02/23/23 1356  Sun Feb 23, 2023  1340 CT Renal Soundra Pilon [JR]    Clinical Course User Index [JR] Gareth Eagle, PA-C                                 Medical Decision Making Amount and/or Complexity of Data Reviewed Labs: ordered. Radiology: ordered. Decision-making details documented in ED Course.   62 year old well-appearing female presenting for flank pain.  Exam was notable for tenderness just below the  right lower quadrant.  DDx includes nephrolithiasis, ovarian torsion, appendicitis, other.  Overall patient looks well and nontoxic.  Tenderness seem to be just below the pelvic region more in her upper right leg.  Suspect this could be MSK pain.  Have low suspicion for ovarian torsion given how well she appears with minimal tenderness and reassuring CT scan.  Also CT scan negative for kidney stone.  Advised her to follow-up with OB/GYN.  Discussed pertinent return precautions.  Vital stable.  Discharged home in condition.        Final Clinical Impression(s) / ED Diagnoses Final diagnoses:  Abdominal pain, unspecified abdominal location    Rx / DC Orders ED Discharge Orders     None         Gareth Eagle, PA-C 02/23/23 1358    Pricilla Loveless, MD 02/26/23 [phone]

## 2023-02-23 NOTE — Discharge Instructions (Signed)
Evaluation today was overall reassuring.  I do recommend that you follow-up with OB/GYN for your lower pelvic pain.  You can take Tylenol ibuprofen at home for your pain can also apply warm compresses.  If you develop abnormal vaginal bleeding or discharge, worsening pain, nausea vomiting diarrhea, please return to the emergency department for further evaluation.

## 2023-02-23 NOTE — ED Notes (Signed)
Patient transported to CT 

## 2023-02-23 NOTE — ED Notes (Signed)
ED Provider at bedside. 

## 2023-02-23 NOTE — ED Notes (Signed)
Pt returned from CT °

## 2023-05-08 ENCOUNTER — Encounter (HOSPITAL_COMMUNITY): Payer: Self-pay

## 2023-05-08 ENCOUNTER — Emergency Department (HOSPITAL_COMMUNITY)
Admission: EM | Admit: 2023-05-08 | Discharge: 2023-05-08 | Disposition: A | Discharge status: Home / Self Care | Payer: BC Managed Care – PPO | Attending: Emergency Medicine | Admitting: Emergency Medicine

## 2023-05-08 ENCOUNTER — Emergency Department (HOSPITAL_COMMUNITY): Payer: BC Managed Care – PPO

## 2023-05-08 ENCOUNTER — Other Ambulatory Visit: Payer: Self-pay

## 2023-05-08 DIAGNOSIS — Z20822 Contact with and (suspected) exposure to covid-19: Secondary | ICD-10-CM | POA: Insufficient documentation

## 2023-05-08 DIAGNOSIS — R0602 Shortness of breath: Secondary | ICD-10-CM

## 2023-05-08 DIAGNOSIS — R918 Other nonspecific abnormal finding of lung field: Secondary | ICD-10-CM | POA: Diagnosis not present

## 2023-05-08 DIAGNOSIS — J45909 Unspecified asthma, uncomplicated: Secondary | ICD-10-CM | POA: Diagnosis not present

## 2023-05-08 DIAGNOSIS — J209 Acute bronchitis, unspecified: Secondary | ICD-10-CM | POA: Diagnosis not present

## 2023-05-08 DIAGNOSIS — F17200 Nicotine dependence, unspecified, uncomplicated: Secondary | ICD-10-CM | POA: Insufficient documentation

## 2023-05-08 LAB — COMPREHENSIVE METABOLIC PANEL
ALT: 12 U/L (ref 0–44)
AST: 18 U/L (ref 15–41)
Albumin: 4.3 g/dL (ref 3.5–5.0)
Alkaline Phosphatase: 58 U/L (ref 38–126)
Anion gap: 9 (ref 5–15)
BUN: 15 mg/dL (ref 6–20)
CO2: 23 mmol/L (ref 22–32)
Calcium: 9 mg/dL (ref 8.9–10.3)
Chloride: 105 mmol/L (ref 98–111)
Creatinine, Ser: 0.68 mg/dL (ref 0.44–1.00)
GFR, Estimated: >60 mL/min (ref >60)
Glucose, Bld: 98 mg/dL (ref 70–99)
Potassium: 4 mmol/L (ref 3.5–5.1)
Sodium: 137 mmol/L (ref 135–145)
Total Bilirubin: 0.7 mg/dL (ref 0.0–1.2)
Total Protein: 7.2 g/dL (ref 6.5–8.1)

## 2023-05-08 LAB — RESP PANEL BY RT-PCR (RSV, FLU A&B, COVID)  RVPGX2
Influenza A by PCR: NEGATIVE
Influenza B by PCR: NEGATIVE
Resp Syncytial Virus by PCR: NEGATIVE
SARS Coronavirus 2 by RT PCR: NEGATIVE

## 2023-05-08 LAB — CBC
HCT: 37.5 % (ref 36.0–46.0)
Hemoglobin: 12.5 g/dL (ref 12.0–15.0)
MCH: 30.6 pg (ref 26.0–34.0)
MCHC: 33.3 g/dL (ref 30.0–36.0)
MCV: 91.9 fL (ref 80.0–100.0)
Platelets: 129 10*3/uL — ABNORMAL LOW (ref 150–400)
RBC: 4.08 MIL/uL (ref 3.87–5.11)
RDW: 13.5 % (ref 11.5–15.5)
WBC: 4.2 10*3/uL (ref 4.0–10.5)
nRBC: 0 % (ref 0.0–0.2)

## 2023-05-08 LAB — BRAIN NATRIURETIC PEPTIDE: B Natriuretic Peptide: 19 pg/mL (ref 0.0–100.0)

## 2023-05-08 MED ORDER — ALBUTEROL SULFATE HFA 108 (90 BASE) MCG/ACT IN AERS
2.0000 | INHALATION_SPRAY | RESPIRATORY_TRACT | Status: DC | PRN
  Administered 2023-05-08: 2 via RESPIRATORY_TRACT
  Filled 2023-05-08: qty 6.7

## 2023-05-08 MED ORDER — AZITHROMYCIN 250 MG PO TABS
500.0000 mg | ORAL_TABLET | Freq: Once | ORAL | Status: AC
  Administered 2023-05-08: 500 mg via ORAL
  Filled 2023-05-08: qty 2

## 2023-05-08 MED ORDER — DOXYCYCLINE HYCLATE 100 MG PO CAPS: 100.0000 mg | ORAL_CAPSULE | Freq: Two times a day (BID) | ORAL | 0 refills | Status: AC

## 2023-05-08 MED ORDER — DOXYCYCLINE HYCLATE 100 MG PO TABS
100.0000 mg | ORAL_TABLET | Freq: Once | ORAL | Status: AC
  Administered 2023-05-08: 100 mg via ORAL
  Filled 2023-05-08: qty 1

## 2023-05-08 MED ORDER — AZITHROMYCIN 250 MG PO TABS: 250.0000 mg | ORAL_TABLET | Freq: Every day | ORAL | 0 refills | Status: AC

## 2023-05-08 MED ORDER — PREDNISONE 20 MG PO TABS: 40.0000 mg | ORAL_TABLET | Freq: Every day | ORAL | 0 refills | Status: AC

## 2023-05-08 NOTE — ED Triage Notes (Signed)
Pt arrived via POV c/o SOB. Pt reports using her inhaler w/o relief. O2 Sats on Room Air 100%.

## 2023-05-08 NOTE — ED Notes (Signed)
ED Provider at bedside. 

## 2023-05-08 NOTE — ED Provider Notes (Signed)
[place] EMERGENCY DEPARTMENT AT [facility] Provider Note   CSN: [id number] Arrival date & time: 05/08/23  0920     History  Chief Complaint  Patient presents with   Shortness of Breath    Alexis Lucas is a 63 y.o. female.  HPI Presents with dyspnea, fatigue.  Patient is a smoker, has history of asthma.  She notes over the past few days she has had minimal energy, and worsening dyspnea.  No chest pain, no abdominal pain, no fever, no cough.  Clear precipitant is cold weather, which seems to make her fatigue worse.  No relief with anything.    Home Medications Prior to Admission medications   Medication Sig Start Date End Date Taking? Authorizing Provider  albuterol (VENTOLIN HFA) 108 (90 Base) MCG/ACT inhaler Inhale 2 puffs into the lungs as needed for wheezing or shortness of breath. 12/05/22  Yes Cathlean Marseilles A, NP  albuterol (PROVENTIL) (2.5 MG/3ML) 0.083% nebulizer solution Take 3 mLs (2.5 mg total) by nebulization every 6 (six) hours as needed for wheezing or shortness of breath. 12/19/21   Particia Nearing, PA-C  benzonatate (TESSALON) 100 MG capsule Take 1 capsule (100 mg total) by mouth 3 (three) times daily as needed for cough. Do not take with alcohol or while driving or operating heavy machinery.  May cause drowsiness. 12/05/22   Valentino Nose, NP  ibuprofen (ADVIL,MOTRIN) 800 MG tablet Take 800 mg by mouth as needed for mild pain or moderate pain.    [provider]  letrozole (FEMARA) 2.5 MG tablet Take 2.5 mg by mouth daily. 05/11/21   [provider]  loratadine (CLARITIN) 10 MG tablet Take by mouth.    [provider]  pantoprazole (PROTONIX) 20 MG tablet TAKE 1 TABLET BY MOUTH EVERY DAY 10/28/22   Aida Raider, NP  promethazine-dextromethorphan (PROMETHAZINE-DM) 6.25-15 MG/5ML syrup Take 5 mLs by mouth at bedtime as needed for cough. Do not take with alcohol or while driving or operating heavy machinery.  May  cause drowsiness. 12/05/22   Valentino Nose, NP      Allergies    Penicillins and Shellfish allergy    Review of Systems   Review of Systems  Physical Exam Updated Vital Signs BP (!) 133/56   Pulse 68   Temp 98.7 F (37.1 C) (Oral)   Resp (!) 22   Ht 5\' 2"  (1.575 m)   Wt 52 kg   SpO2 98%   BMI 20.97 kg/m  Physical Exam Vitals and nursing note reviewed.  Constitutional:      General: She is not in acute distress.    Appearance: She is well-developed.  HENT:     Head: Normocephalic and atraumatic.  Eyes:     Conjunctiva/sclera: Conjunctivae normal.  Cardiovascular:     Rate and Rhythm: Normal rate and regular rhythm.  Pulmonary:     Effort: Pulmonary effort is normal. Tachypnea present. No respiratory distress.     Breath sounds: No stridor. Decreased breath sounds present.  Abdominal:     General: There is no distension.  Skin:    General: Skin is warm and dry.  Neurological:     Mental Status: She is alert and oriented to person, place, and time.     Cranial Nerves: No cranial nerve deficit.  Psychiatric:        Mood and Affect: Mood normal.     ED Results / Procedures / Treatments   Labs (all labs ordered are  listed, but only abnormal results are displayed) Labs Reviewed  RESP PANEL BY RT-PCR (RSV, FLU A&B, COVID)  [id number]  COMPREHENSIVE METABOLIC PANEL  CBC  BRAIN NATRIURETIC PEPTIDE    EKG None  Radiology DG Chest 2 View Result Date: 05/08/2023 CLINICAL DATA:  Shortness of breath EXAM: CHEST - 2 VIEW COMPARISON:  Chest radiograph dated 09/28/2017 FINDINGS: Hyperinflated lungs. Diffuse bilateral interstitial opacities. Blunting of the bilateral costophrenic angles. No pneumothorax. The heart size and mediastinal contours are within normal limits. No acute osseous abnormality. IMPRESSION: 1. Diffuse bilateral interstitial opacities, which may represent pulmonary edema or atypical infection. 2. Blunting of the bilateral costophrenic angles, which may  represent small pleural effusions. Electronically Signed   By: Agustin Cree M.D.   On: 05/08/2023 10:15    Procedures Procedures    Medications Ordered in ED Medications  albuterol (VENTOLIN HFA) 108 (90 Base) MCG/ACT inhaler 2 puff (has no administration in time range)    ED Course/ Medical Decision Making/ A&P                                 Medical Decision Making Adult female with history of smoking, asthma, presents with fatigue, dyspnea.  Broad differential including pneumonia, bronchitis, asthma exacerbation, bacteremia, sepsis, heart failure.   Amount and/or Complexity of Data Reviewed External Data Reviewed: notes. Labs: ordered. Decision-making details documented in ED Course. Radiology: ordered and independent interpretation performed. Decision-making details documented in ED Course. ECG/medicine tests: ordered and independent interpretation performed. Decision-making details documented in ED Course.  Risk Prescription drug management. Decision regarding hospitalization. Diagnosis or treatment significantly limited by social determinants of health.   2:34 PM Patient awake, alert, sitting upright.  No oxygen requirement, labs reviewed, unremarkable, vital signs remained stable, no fever, hypotension.  Suspicion for viral process versus pneumonia no evidence for bacteremia, sepsis.  COPD exacerbation though she does not have a formal diagnosis at this, also considered. Patient is a smoker and she and I spoke about the need for smoking cessation.  Patient discharged in stable condition.        Final Clinical Impression(s) / ED Diagnoses Final diagnoses:  SOB (shortness of breath)  Acute bronchitis, unspecified organism     Gerhard Munch, MD 05/08/23 1435

## 2023-05-08 NOTE — Discharge Instructions (Addendum)
As discussed, you have been diagnosed with pneumonia.  In addition to taking your prescribed steroids, and antibiotics, please discuss with your physician additional assistance for smoking cessation.  Return here for concerning changes in your condition.

## 2023-05-11 ENCOUNTER — Other Ambulatory Visit: Payer: Self-pay | Admitting: Gastroenterology

## 2023-05-30 DIAGNOSIS — Z5181 Encounter for therapeutic drug level monitoring: Secondary | ICD-10-CM | POA: Diagnosis not present

## 2023-05-30 DIAGNOSIS — F17200 Nicotine dependence, unspecified, uncomplicated: Secondary | ICD-10-CM | POA: Diagnosis not present

## 2023-05-30 DIAGNOSIS — F1290 Cannabis use, unspecified, uncomplicated: Secondary | ICD-10-CM | POA: Diagnosis not present

## 2023-05-30 DIAGNOSIS — Z79811 Long term (current) use of aromatase inhibitors: Secondary | ICD-10-CM | POA: Diagnosis not present

## 2023-05-30 DIAGNOSIS — Z170 Estrogen receptor positive status [ER+]: Secondary | ICD-10-CM | POA: Diagnosis not present

## 2023-05-30 DIAGNOSIS — C50912 Malignant neoplasm of unspecified site of left female breast: Secondary | ICD-10-CM | POA: Diagnosis not present

## 2023-05-30 DIAGNOSIS — C50412 Malignant neoplasm of upper-outer quadrant of left female breast: Secondary | ICD-10-CM | POA: Diagnosis not present

## 2023-06-30 DIAGNOSIS — J440 Chronic obstructive pulmonary disease with acute lower respiratory infection: Secondary | ICD-10-CM | POA: Diagnosis not present

## 2023-06-30 DIAGNOSIS — Z720 Tobacco use: Secondary | ICD-10-CM | POA: Diagnosis not present

## 2023-11-20 ENCOUNTER — Emergency Department (HOSPITAL_COMMUNITY): Admission: EM | Admit: 2023-11-20 | Discharge: 2023-11-20 | Disposition: A | Discharge status: Home / Self Care

## 2023-11-20 ENCOUNTER — Other Ambulatory Visit: Payer: Self-pay

## 2023-11-20 ENCOUNTER — Emergency Department (HOSPITAL_COMMUNITY)

## 2023-11-20 ENCOUNTER — Encounter (HOSPITAL_COMMUNITY): Payer: Self-pay

## 2023-11-20 DIAGNOSIS — M50222 Other cervical disc displacement at C5-C6 level: Secondary | ICD-10-CM | POA: Diagnosis not present

## 2023-11-20 DIAGNOSIS — R202 Paresthesia of skin: Secondary | ICD-10-CM | POA: Diagnosis not present

## 2023-11-20 DIAGNOSIS — M47812 Spondylosis without myelopathy or radiculopathy, cervical region: Secondary | ICD-10-CM | POA: Diagnosis not present

## 2023-11-20 DIAGNOSIS — I672 Cerebral atherosclerosis: Secondary | ICD-10-CM | POA: Diagnosis not present

## 2023-11-20 DIAGNOSIS — R55 Syncope and collapse: Secondary | ICD-10-CM | POA: Diagnosis not present

## 2023-11-20 DIAGNOSIS — I6523 Occlusion and stenosis of bilateral carotid arteries: Secondary | ICD-10-CM | POA: Diagnosis not present

## 2023-11-20 DIAGNOSIS — M50223 Other cervical disc displacement at C6-C7 level: Secondary | ICD-10-CM | POA: Diagnosis not present

## 2023-11-20 DIAGNOSIS — R200 Anesthesia of skin: Secondary | ICD-10-CM | POA: Diagnosis not present

## 2023-11-20 DIAGNOSIS — M4802 Spinal stenosis, cervical region: Secondary | ICD-10-CM | POA: Diagnosis not present

## 2023-11-20 DIAGNOSIS — R42 Dizziness and giddiness: Secondary | ICD-10-CM | POA: Insufficient documentation

## 2023-11-20 LAB — TROPONIN I (HIGH SENSITIVITY)
Troponin I (High Sensitivity): 4 ng/L (ref <18)
Troponin I (High Sensitivity): 4 ng/L (ref <18)

## 2023-11-20 LAB — COMPREHENSIVE METABOLIC PANEL WITH GFR
ALT: 13 U/L (ref 0–44)
AST: 26 U/L (ref 15–41)
Albumin: 4.1 g/dL (ref 3.5–5.0)
Alkaline Phosphatase: 64 U/L (ref 38–126)
Anion gap: 10 (ref 5–15)
BUN: 14 mg/dL (ref 6–20)
CO2: 22 mmol/L (ref 22–32)
Calcium: 8.8 mg/dL — ABNORMAL LOW (ref 8.9–10.3)
Chloride: 105 mmol/L (ref 98–111)
Creatinine, Ser: 0.72 mg/dL (ref 0.44–1.00)
GFR, Estimated: >60 mL/min (ref >60)
Glucose, Bld: 99 mg/dL (ref 70–99)
Potassium: 4.4 mmol/L (ref 3.5–5.1)
Sodium: 137 mmol/L (ref 135–145)
Total Bilirubin: 1 mg/dL (ref 0.0–1.2)
Total Protein: 7.6 g/dL (ref 6.5–8.1)

## 2023-11-20 LAB — CBC WITH DIFFERENTIAL/PLATELET
Abs Granulocyte: 2 K/uL (ref 1.5–6.5)
Abs Immature Granulocytes: 0.01 K/uL (ref 0.00–0.07)
Basophils Absolute: 0 K/uL (ref 0.0–0.1)
Basophils Relative: 1 %
Eosinophils Absolute: 0.1 K/uL (ref 0.0–0.5)
Eosinophils Relative: 2 %
HCT: 40.1 % (ref 36.0–46.0)
Hemoglobin: 13.5 g/dL (ref 12.0–15.0)
Immature Granulocytes: 0 %
Lymphocytes Relative: 37 %
Lymphs Abs: 1.5 K/uL (ref 0.7–4.0)
MCH: 31.3 pg (ref 26.0–34.0)
MCHC: 33.7 g/dL (ref 30.0–36.0)
MCV: 93 fL (ref 80.0–100.0)
Monocytes Absolute: 0.4 K/uL (ref 0.1–1.0)
Monocytes Relative: 10 %
Neutro Abs: 2 K/uL (ref 1.7–7.7)
Neutrophils Relative %: 50 %
Platelets: 156 K/uL (ref 150–400)
RBC: 4.31 MIL/uL (ref 3.87–5.11)
RDW: 13.2 % (ref 11.5–15.5)
WBC: 4 K/uL (ref 4.0–10.5)
nRBC: 0 % (ref 0.0–0.2)

## 2023-11-20 MED ORDER — MECLIZINE HCL 12.5 MG PO TABS
25.0000 mg | ORAL_TABLET | Freq: Once | ORAL | Status: AC
  Administered 2023-11-20: 25 mg via ORAL
  Filled 2023-11-20: qty 2

## 2023-11-20 MED ORDER — MECLIZINE HCL 25 MG PO TABS: 25.0000 mg | ORAL_TABLET | Freq: Three times a day (TID) | ORAL | 0 refills | Status: AC | PRN

## 2023-11-20 MED ORDER — IOHEXOL 350 MG/ML SOLN
75.0000 mL | Freq: Once | INTRAVENOUS | Status: AC | PRN
  Administered 2023-11-20: 75 mL via INTRAVENOUS

## 2023-11-20 NOTE — ED Notes (Signed)
 Pt went to sleep at 1130pm last night wnl. Woke with dizziness at 5am which was almost constant with ears feeling full. Pt states felt like she was going to pass out at work. C/o left arm being numb all the way up around 7am. Denies headache/nausea. Pt moving all extremities and pupils perrla.

## 2023-11-20 NOTE — ED Notes (Signed)
 Called lab about blood work and they stated it is running now

## 2023-11-20 NOTE — ED Provider Notes (Signed)
 [place] EMERGENCY DEPARTMENT AT [facility] Provider Note   CSN: [id number] Arrival date & time: 11/20/23  9086     Patient presents with: Dizziness   Alexis Lucas is a 63 y.o. female.   63 year old female presents for evaluation of dizziness.  She states she went to bed around 11:30 PM last night and woke up this morning feeling dizzy like the room was moving.  States has been persistent.  States she also has some left-sided numbness that started about an hour ago.  Patient has a history of some weakness on the left side from previous surgeries but no history of stroke.  She denies any other symptoms or concerns at this time.   Dizziness Associated symptoms: no chest pain, no palpitations, no shortness of breath and no vomiting        Prior to Admission medications   Medication Sig Start Date End Date Taking? Authorizing Provider  meclizine  (ANTIVERT ) 25 MG tablet Take 1 tablet (25 mg total) by mouth 3 (three) times daily as needed for dizziness. 11/20/23  Yes Yeiden Frenkel L, DO  albuterol  (PROVENTIL ) (2.5 MG/3ML) 0.083% nebulizer solution Take 3 mLs (2.5 mg total) by nebulization every 6 (six) hours as needed for wheezing or shortness of breath. 12/19/21   Stuart Vernell Norris, PA-C  albuterol  (VENTOLIN  HFA) 108 [id number] Base) MCG/ACT inhaler Inhale 2 puffs into the lungs as needed for wheezing or shortness of breath. 12/05/22   Chandra Harlene LABOR, NP  benzonatate  (TESSALON ) 100 MG capsule Take 1 capsule (100 mg total) by mouth 3 (three) times daily as needed for cough. Do not take with alcohol or while driving or operating heavy machinery.  May cause drowsiness. 12/05/22   Chandra Harlene LABOR, NP  doxycycline  (VIBRAMYCIN ) 100 MG capsule Take 1 capsule (100 mg total) by mouth 2 (two) times daily. 05/08/23   Garrick Charleston, MD  ibuprofen  (ADVIL ,MOTRIN ) 800 MG tablet Take 800 mg by mouth as needed for mild pain or moderate pain.    [provider]  letrozole (FEMARA)  2.5 MG tablet Take 2.5 mg by mouth daily. 05/11/21   [provider]  loratadine  (CLARITIN ) 10 MG tablet Take by mouth.    [provider]  pantoprazole  (PROTONIX ) 20 MG tablet TAKE 1 TABLET BY MOUTH EVERY DAY 05/12/23   Kennedy Charmaine CROME, NP  predniSONE  (DELTASONE ) 20 MG tablet Take 2 tablets (40 mg total) by mouth daily with breakfast. For the next four days 05/08/23   Garrick Charleston, MD  promethazine -dextromethorphan (PROMETHAZINE -DM) 6.25-15 MG/5ML syrup Take 5 mLs by mouth at bedtime as needed for cough. Do not take with alcohol or while driving or operating heavy machinery.  May cause drowsiness. 12/05/22   Chandra Harlene LABOR, NP    Allergies: Penicillins and Shellfish allergy    Review of Systems  Constitutional:  Negative for chills and fever.  HENT:  Negative for ear pain and sore throat.   Eyes:  Negative for pain and visual disturbance.  Respiratory:  Negative for cough and shortness of breath.   Cardiovascular:  Negative for chest pain and palpitations.  Gastrointestinal:  Negative for abdominal pain and vomiting.  Genitourinary:  Negative for dysuria and hematuria.  Musculoskeletal:  Negative for arthralgias and back pain.  Skin:  Negative for color change and rash.  Neurological:  Positive for dizziness. Negative for seizures and syncope.  All other systems reviewed and are negative.   Updated Vital Signs BP 138/72   Pulse 65  Temp 98.1 F (36.7 C) (Oral)   Resp 16   Ht 5' 2 (1.575 m)   Wt 52 kg   SpO2 96%   BMI 20.97 kg/m   Physical Exam Vitals and nursing note reviewed.  Constitutional:      General: She is not in acute distress.    Appearance: She is well-developed. She is not ill-appearing.  HENT:     Head: Normocephalic and atraumatic.  Eyes:     Conjunctiva/sclera: Conjunctivae normal.  Cardiovascular:     Rate and Rhythm: Normal rate and regular rhythm.     Heart sounds: No murmur heard. Pulmonary:     Effort: Pulmonary effort is  normal. No respiratory distress.     Breath sounds: Normal breath sounds.  Abdominal:     Palpations: Abdomen is soft.     Tenderness: There is no abdominal tenderness.  Musculoskeletal:        General: No swelling.     Cervical back: Neck supple.  Skin:    General: Skin is warm and dry.     Capillary Refill: Capillary refill takes less than 2 seconds.  Neurological:     Mental Status: She is alert.     Comments: Patient with some sensory deficits over the left arm leg and face, she has no nystagmus on exam.  Muscle strength is 4 out of 5 in left upper extremity but that is the patient's baseline, she has no pronator drift, normal finger-to-nose and heel-to-shin testing  Psychiatric:        Mood and Affect: Mood normal.     (all labs ordered are listed, but only abnormal results are displayed) Labs Reviewed  COMPREHENSIVE METABOLIC PANEL WITH GFR - Abnormal; Notable for the following components:      Result Value   Calcium 8.8 (*)    All other components within normal limits  CBC WITH DIFFERENTIAL/PLATELET  TROPONIN I (HIGH SENSITIVITY)  TROPONIN I (HIGH SENSITIVITY)    EKG: EKG Interpretation Date/Time:  Thursday November 20 2023 10:01:26 EDT Ventricular Rate:  54 PR Interval:  195 QRS Duration:  89 QT Interval:  446 QTC Calculation: 423 R Axis:   67  Text Interpretation: Sinus rhythm Probable left atrial enlargement Nonspecific T abnrm, anterolateral leads Minimal ST elevation, inferior leads Compared with prior EKG from 05/08/2023 Confirmed by Gennaro Bouchard ([id number]) on 11/20/2023 10:04:56 AM  Radiology: MR Cervical Spine Wo Contrast Result Date: 11/20/2023 CLINICAL DATA:  63 year old female with dizziness, left-sided numbness. EXAM: MRI CERVICAL SPINE WITHOUT CONTRAST TECHNIQUE: Multiplanar, multisequence MR imaging of the cervical spine was performed. No intravenous contrast was administered. COMPARISON:  Brain MRI today.  Cervical spine radiograph 05/10/2020. FINDINGS:  Alignment: Relatively normal cervical lordosis. Subtle anterolisthesis C4-C5 through C6-C7. Vertebrae: Normal background bone marrow signal. Degenerative appearing sclerosis of the odontoid process. Maintained vertebral height. Chronic degenerative facet marrow signal changes, most notably on the left at C4-C5 (series 6, image 13) with no convincing marrow edema or acute osseous abnormality. Cord: Normal.  Capacious spinal canal. Posterior Fossa, vertebral arteries, paraspinal tissues: Cervicomedullary junction is within normal limits. Brain MRI today reported separately. Preserved major vascular flow voids in the neck, late entry of the right vertebral artery into the cervical transverse foramina as noted on series 8, image 17 (normal variant). Fairly codominant distal vertebral artery flow voids. Negative paraspinal and visible neck soft tissues. Negative visible lung apices. Disc levels: C2-C3:  Negative. C3-C4: Negative disc. Mild facet hypertrophy. Mild right C4 foraminal stenosis. C4-C5: Negative  disc. Moderate bilateral facet hypertrophy. No spinal stenosis. Mild to moderate left C5 foraminal stenosis. C5-C6: Minor disc bulging. Mild to moderate facet and ligament flavum hypertrophy. No spinal stenosis. Mild right C6 foraminal stenosis. C6-C7: Minimal disc bulge. Mild facet, mild to moderate ligament flavum hypertrophy. No stenosis. C7-T1:  Mild facet hypertrophy.  No stenosis. Negative for age visible upper thoracic spine except for dorsal epidural lipomatosis (series 6, image 7) beginning at the T3-T4 level. IMPRESSION: 1. Generally mild for age cervical spine degeneration, primarily facet arthropathy. No disc herniation or spinal stenosis. Up to moderate neural foraminal stenosis at the left C5 nerve levels. 2. Dorsal epidural lipomatosis in the visible upper thoracic spine. Electronically Signed   By: VEAR Hurst M.D.   On: 11/20/2023 14:09   MR BRAIN WO CONTRAST Result Date: 11/20/2023 CLINICAL DATA:   63 year old female with dizziness, left-sided numbness. EXAM: MRI HEAD WITHOUT CONTRAST TECHNIQUE: Multiplanar, multiecho pulse sequences of the brain and surrounding structures were obtained without intravenous contrast. COMPARISON:  CT head and CTA head and neck this morning. FINDINGS: Brain: Cerebral volume is within normal limits for age. No restricted diffusion to suggest acute infarction. No midline shift, mass effect, evidence of mass lesion, ventriculomegaly, extra-axial collection or acute intracranial hemorrhage. Cervicomedullary junction and pituitary are within normal limits. Patchy periventricular and other scattered cerebral white matter T2 and FLAIR hyperintensity, mild to moderate for age. Pattern is nonspecific. No cortical encephalomalacia or chronic cerebral blood products identified. Small cavum septum pellucidum, normal variant. Deep gray nuclei, brainstem and cerebellum appear negative. Vascular: Major intracranial vascular flow voids are preserved. Skull and upper cervical spine: Visualized bone marrow signal is within normal limits. Cervical spine detailed separately. Sinuses/Orbits: Negative. Other: Mastoids are clear. Visible internal auditory structures appear normal. Normal stylomastoid foramina. Negative visible scalp and face. IMPRESSION: 1. No acute intracranial abnormality. 2. Mild to moderate for age white matter signal changes, most commonly due to chronic small vessel disease. Electronically Signed   By: VEAR Hurst M.D.   On: 11/20/2023 14:02   CT Angio Head Neck W WO CM Result Date: 11/20/2023 EXAM: CTA Head and Neck with Intravenous Contrast. CT Head without Contrast. CLINICAL HISTORY: Dizziness, left sided numbness. Patient went to sleep at 11:30 PM last night with no complaints. Woke with dizziness at 5:00 AM which was almost constant with ears feeling full. Patient states felt like she was going to pass out at work. Complains of left arm being numb all the way up around 7:00  AM. Denies headache or nausea. Patient moving all extremities and pupils PERRLA. TECHNIQUE: Axial CTA images of the head and neck performed with intravenous contrast. MIP reconstructed images were created and reviewed. Axial computed tomography images of the head/brain performed without intravenous contrast. Note: Per PQRS, the description of internal carotid artery percent stenosis, including 0 percent or normal exam, is based on [place] American Symptomatic Carotid Endarterectomy Trial (NASCET) criteria. Dose reduction technique was used including one or more of the following: automated exposure control, adjustment of mA and kV according to patient size, and/or iterative reconstruction. CONTRAST: Less than 75mL of iohexol  (OMNIPAQUE ) 350 mg/ml injection. COMPARISON: CT Head dated June 18, 2016. FINDINGS: CT HEAD: BRAIN: No acute intraparenchymal hemorrhage. No mass lesion. No CT evidence for acute territorial infarct. No midline shift or extra-axial collection. VENTRICLES: No hydrocephalus. ORBITS: The orbits are unremarkable. SINUSES AND MASTOIDS: The paranasal sinuses and mastoid air cells are clear. CTA NECK: COMMON CAROTID ARTERIES: No significant stenosis. No dissection  or occlusion. Calcific plaque present within the carotid bulbs bilaterally, with less than 10% stenosis present bilaterally. INTERNAL CAROTID ARTERIES: No stenosis by NASCET criteria. No dissection or occlusion. VERTEBRAL ARTERIES: The left vertebral artery arises directly from the aortic arch. No significant stenosis. No dissection or occlusion. CTA HEAD: ANTERIOR CEREBRAL ARTERIES: No significant stenosis. No occlusion. No aneurysm. MIDDLE CEREBRAL ARTERIES: No significant stenosis. No occlusion. No aneurysm. POSTERIOR CEREBRAL ARTERIES: No significant stenosis. No occlusion. No aneurysm. BASILAR ARTERY: No significant stenosis. No occlusion. No aneurysm. OTHER: There is a complete Circle of Willis. SOFT TISSUES: No acute finding. No masses or  lymphadenopathy. BONES: No acute osseous abnormality. Mild calcific atheromatous disease within the aortic arch. IMPRESSION: 1. No acute intracranial hemorrhage or ischemic change. 2. 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 Electronically signed by: evalene coho 11/20/2023 11:26 AM EDT RP Workstation: [id number]     Procedures   Medications Ordered in the ED  meclizine  (ANTIVERT ) tablet 25 mg (25 mg Oral Given 11/20/23 1000)  iohexol  (OMNIPAQUE ) 350 MG/ML injection 75 mL (75 mLs Intravenous Contrast Given 11/20/23 1108)                                    Medical Decision Making Cardiac monitor interpretation: Sinus rhythm, no ectopy  Patient labwork reviewed by me and unremarkable.  She was given a dose of meclizine  and her left-sided numbness resolved except for that in her arm which she always has.  Dizziness improved as well.  Imaging as below and was discussed with neurology who recommended outpatient follow-up and no need for admission at this time.  I will give patient a prescription for meclizine .  Advised to follow with primary care and return to the ER for any new or worsening symptoms.  Feels comfortable to plan be discharged home.  Problems Addressed: Paresthesias: acute illness or injury Vertigo: acute illness or injury  Amount and/or Complexity of Data Reviewed External Data Reviewed: notes.    Details: Prior ED records reviewed and patient last seen in the ER in January of this year for shortness of breath Labs: ordered. Decision-making details documented in ED Course.    Details: Ordered and reviewed by me and unremarkable, CBC and BMP are negative, troponin is negative x 2 Radiology: ordered and independent interpretation performed. Decision-making details documented in ED Course.    Details: Ordered and reviewed by me and discussed with  neurology CT angiogram of the head and neck shows no acute intracranial process MRI of the brain without contrast shows no evidence of acute infarct, but some small vessel disease MRI of the C-spine shows some evidence of age-related changes  ECG/medicine tests: ordered and independent interpretation performed. Decision-making details documented in ED Course.    Details: Ordered and interpreted by me in the absence of cardiology and shows sinus rhythm, no STEMI and no acute abnormality otherwise Discussion of management or test interpretation with external provider(s): Dr. Merrianne -neurology-I spoke with him on the phone and he recommended MRI and if it is negative patient can likely go home.  Recommended no need for stroke workup at this time as her symptoms have resolved  Risk OTC drugs. Prescription drug management. Drug therapy requiring intensive monitoring for toxicity.    Final diagnoses:  Vertigo  Paresthesias    ED Discharge Orders  Ordered    meclizine  (ANTIVERT ) 25 MG tablet  3 times daily PRN        11/20/23 1439               Nas Wafer L, DO 11/20/23 1508

## 2023-11-20 NOTE — ED Triage Notes (Signed)
 Pt arrived via POV c/o dizziness, feeling lightheaded and reports symptoms began this morning.

## 2023-11-20 NOTE — ED Notes (Signed)
 Pt returned from ct

## 2023-11-20 NOTE — Discharge Instructions (Addendum)
 You can take your meclizine  up to 3 times a day as needed for dizziness.  Follow-up with your primary care doctor in 1 week.  Return to the ER for any new or worsening symptoms.

## 2023-12-04 DIAGNOSIS — C50912 Malignant neoplasm of unspecified site of left female breast: Secondary | ICD-10-CM | POA: Diagnosis not present

## 2023-12-04 DIAGNOSIS — C50412 Malignant neoplasm of upper-outer quadrant of left female breast: Secondary | ICD-10-CM | POA: Diagnosis not present

## 2023-12-04 DIAGNOSIS — F1290 Cannabis use, unspecified, uncomplicated: Secondary | ICD-10-CM | POA: Diagnosis not present

## 2023-12-04 DIAGNOSIS — Z79811 Long term (current) use of aromatase inhibitors: Secondary | ICD-10-CM | POA: Diagnosis not present

## 2023-12-04 DIAGNOSIS — Z13820 Encounter for screening for osteoporosis: Secondary | ICD-10-CM | POA: Diagnosis not present

## 2023-12-04 DIAGNOSIS — F17200 Nicotine dependence, unspecified, uncomplicated: Secondary | ICD-10-CM | POA: Diagnosis not present

## 2023-12-04 DIAGNOSIS — Z5181 Encounter for therapeutic drug level monitoring: Secondary | ICD-10-CM | POA: Diagnosis not present

## 2023-12-04 DIAGNOSIS — Z170 Estrogen receptor positive status [ER+]: Secondary | ICD-10-CM | POA: Diagnosis not present

## 2024-02-20 DIAGNOSIS — Z170 Estrogen receptor positive status [ER+]: Secondary | ICD-10-CM | POA: Diagnosis not present

## 2024-02-20 DIAGNOSIS — C50412 Malignant neoplasm of upper-outer quadrant of left female breast: Secondary | ICD-10-CM | POA: Diagnosis not present

## 2024-02-20 DIAGNOSIS — R92333 Mammographic heterogeneous density, bilateral breasts: Secondary | ICD-10-CM | POA: Diagnosis not present

## 2024-02-20 DIAGNOSIS — Z853 Personal history of malignant neoplasm of breast: Secondary | ICD-10-CM | POA: Diagnosis not present

## 2024-02-20 DIAGNOSIS — Z8639 Personal history of other endocrine, nutritional and metabolic disease: Secondary | ICD-10-CM | POA: Diagnosis not present

## 2024-02-20 DIAGNOSIS — Z13820 Encounter for screening for osteoporosis: Secondary | ICD-10-CM | POA: Diagnosis not present
